# Patient Record
Sex: Male | Born: 1965 | Race: White | Hispanic: No | Marital: Married | Smoking: Never smoker
Health system: Southern US, Community
[De-identification: ages and names within clinical notes are randomized; demographics above are authoritative.]

## PROBLEM LIST (undated history)

## (undated) DIAGNOSIS — M722 Plantar fascial fibromatosis: Secondary | ICD-10-CM

## (undated) DIAGNOSIS — L209 Atopic dermatitis, unspecified: Secondary | ICD-10-CM

## (undated) DIAGNOSIS — E785 Hyperlipidemia, unspecified: Secondary | ICD-10-CM

## (undated) DIAGNOSIS — I10 Essential (primary) hypertension: Secondary | ICD-10-CM

## (undated) HISTORY — DX: Plantar fascial fibromatosis: M72.2

## (undated) HISTORY — DX: Atopic dermatitis, unspecified: L20.9

## (undated) HISTORY — PX: FRACTURE SURGERY: SHX138

## (undated) HISTORY — DX: Essential (primary) hypertension: I10

## (undated) HISTORY — DX: Hyperlipidemia, unspecified: E78.5

---

## 1989-09-30 HISTORY — PX: WRIST FRACTURE SURGERY: SHX121

## 2013-06-16 ENCOUNTER — Ambulatory Visit (INDEPENDENT_AMBULATORY_CARE_PROVIDER_SITE_OTHER): Payer: 59 | Admitting: Adult Health

## 2013-06-16 ENCOUNTER — Encounter: Payer: Self-pay | Admitting: Adult Health

## 2013-06-16 VITALS — BP 110/74 | HR 93 | Temp 98.2°F | Resp 12 | Ht 69.75 in | Wt 210.2 lb

## 2013-06-16 DIAGNOSIS — Z0000 Encounter for general adult medical examination without abnormal findings: Secondary | ICD-10-CM | POA: Insufficient documentation

## 2013-06-16 DIAGNOSIS — E669 Obesity, unspecified: Secondary | ICD-10-CM | POA: Insufficient documentation

## 2013-06-16 DIAGNOSIS — G479 Sleep disorder, unspecified: Secondary | ICD-10-CM

## 2013-06-16 DIAGNOSIS — E785 Hyperlipidemia, unspecified: Secondary | ICD-10-CM | POA: Insufficient documentation

## 2013-06-16 MED ORDER — ATORVASTATIN CALCIUM 10 MG PO TABS: 10.0000 mg | ORAL_TABLET | Freq: Every day | ORAL | Status: DC

## 2013-06-16 MED ORDER — TEMAZEPAM 15 MG PO CAPS: 15.0000 mg | ORAL_CAPSULE | Freq: Every evening | ORAL | Status: DC | PRN

## 2013-06-16 NOTE — Assessment & Plan Note (Signed)
Normal physical exam. No history of depression, no tobacco use. Cholesterol elevated and starting atorvastatin. Check CBC and hepatic panel. Other labs drawn during health fair through his employer.

## 2013-06-16 NOTE — Assessment & Plan Note (Signed)
Patient has tried diet and exercise without being able to regulate his cholesterol. Start atorvastatin 10 mg at bedtime. Check hepatic panel. Creatinine has been checked and is normal. Return in 3 months for followup and lab work.

## 2013-06-16 NOTE — Patient Instructions (Addendum)
  Please have your labs drawn at [facility] prior to starting the Atorvastatin.  Then start your cholesterol medication (Atorvastatin) and take at bedtime.  Temazepam 15 mg as needed nightly for help with sleep.  Thank you for choosing [facility] for your health care needs.  The results will be available through MyChart for your convenience. Please remember to activate this. The activation code is located at the end of this form.

## 2013-06-16 NOTE — Assessment & Plan Note (Addendum)
Exercising most days depending on how tired he is feeling from work. Trying to work on losing some weight. Starting medication for cholesterol. Continue to follow. Consider medication for appetite suppression if ongoing diet exercise not effective.

## 2013-06-16 NOTE — Assessment & Plan Note (Signed)
Unable to turn his brain off at night. He has tried multiple sleep aids without success. He has also been prescribed multiple medications to help with sleep, however, none have proven beneficial. Start temazepam 15 mg at bedtime as needed.

## 2013-06-16 NOTE — Progress Notes (Signed)
Subjective:    Patient ID: Jonathan Key, male    DOB: November 30, 1965, 47 y.o.   MRN: [id number]  HPI  Patient is a pleasant 47 y/o male who presents to clinic to establish care. He was previously living in [place], [place] and had a PCP there.   Having problems with insomnia for many years. He has tried numerous OTC aids and prescriptions such as lunesta, Remus Loffler but none of these have been effective. The only medication he has found helpful is temazepam. This was prescribed by his previous PCP however he currently does not have a prescription for it. Patient brings with him lab work that was done as part of a health screen at his job, [employer]. Hemoglobin A1c 5.6%, creatinine 1.13 mg/dL, GFR 77, total cholesterol 243 mg/dL, triglycerides 409 mg/dL, HDL 72 mg/dL, LDL 811 mg/dL, glucose 96 mg/dL.  Patient reports that he has had elevated cholesterol for quite some time. He has tried diet and exercise without being able to regulate his cholesterol. He reports exercising regularly and following a healthy diet. He has never been on any medication for hyperlipidemia.    Past Medical History  Diagnosis Date  . Hyperlipidemia   . Hypertension   . Plantar fasciitis      Past Surgical History  Procedure Laterality Date  . Fracture surgery  2002 and 2003    right arm fracture with 3 surgical repairs     Family History  Problem Relation Age of Onset  . Cancer Maternal Aunt 60    Breast Cancer   . Diabetes Maternal Grandmother   . Cancer Maternal Grandmother   . Dementia Maternal Grandmother   . Cancer Maternal Grandfather 97    Stomach Cancer  . Parkinson's disease Paternal Grandfather      History   Social History  . Marital Status: Married    Spouse Name: Murry Diaz    Number of Children: N/A  . Years of Education: 15   Occupational History  . Not on file.   Social History Main Topics  . Smoking status: Never Smoker   . Smokeless tobacco: Never Used  . Alcohol  Use: Yes     Comment: 5-7 drinks per day  . Drug Use: No  . Sexual Activity: Not on file   Other Topics Concern  . Not on file   Social History Narrative   Kaelyn grew up in [place]. 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 He worked in [employer] and moved quite often with them up and down the [employer]. He is currently working for [employer] as an Chief Financial Officer. He lives at home with his wife, [name], of 15 years. They do not have any children. They have a cat. Hikeem enjoys yard work, traveling and riding motorcycle.      Review of Systems  Constitutional: Negative.   HENT: Negative.   Eyes: Negative.   Respiratory: Negative.   Cardiovascular: Negative.   Gastrointestinal: Negative.   Endocrine: Negative.   Genitourinary: Negative.   Musculoskeletal: Negative for back pain, joint swelling and gait problem.       Occasional pain in hands. He believes it may be arthritis. It is not debilitating. Problems with plantar fasciitis. He has been followed by a podiatrist.  Skin: Negative.   Allergic/Immunologic: Negative.   Neurological: Negative.   Hematological: Negative.   Psychiatric/Behavioral: Positive for sleep disturbance.  No depression or feelings of hopelessness.     BP 110/74  Pulse 93  Temp(Src) 98.2 F (36.8 C) (Oral)  Resp 12  Ht 5' 9.75" (1.772 m)  Wt 210 lb 4 oz (95.369 kg)  BMI 30.37 kg/m2  SpO2 98%    Objective:   Physical Exam  Constitutional: He is oriented to person, place, and time. He appears well-developed and well-nourished. No distress.  HENT:  Head: Normocephalic and atraumatic.  Right Ear: External ear normal.  Left Ear: External ear normal.  Nose: Nose normal.  Mouth/Throat: Oropharynx is clear and moist.  Eyes: EOM are normal. Pupils are equal, round, and reactive to light.  Neck: Normal range of motion.  Neck supple. No tracheal deviation present. No thyromegaly present.  Cardiovascular: Normal rate, regular rhythm, normal heart sounds and intact distal pulses.  Exam reveals no gallop and no friction rub.   No murmur heard. Pulmonary/Chest: Effort normal and breath sounds normal. No respiratory distress. He has no wheezes. He has no rales. He exhibits no tenderness.  Abdominal: Soft. Bowel sounds are normal. He exhibits no distension and no mass. There is no tenderness. There is no rebound and no guarding.  Musculoskeletal: Normal range of motion. He exhibits no edema and no tenderness.  Lymphadenopathy:    He has no cervical adenopathy.  Neurological: He is alert and oriented to person, place, and time. He has normal reflexes. No cranial nerve deficit. Coordination normal.  Skin: Skin is warm and dry. No rash noted. No erythema. No pallor.  Psychiatric: He has a normal mood and affect. His behavior is normal. Judgment and thought content normal.       Assessment & Plan:

## 2013-06-18 ENCOUNTER — Telehealth: Payer: Self-pay | Admitting: *Deleted

## 2013-06-18 NOTE — Telephone Encounter (Signed)
Left message, notifying pt of normal lab results.

## 2013-06-28 ENCOUNTER — Encounter: Payer: Self-pay | Admitting: Adult Health

## 2013-08-05 ENCOUNTER — Other Ambulatory Visit: Payer: Self-pay

## 2013-08-30 ENCOUNTER — Encounter: Payer: Self-pay | Admitting: Adult Health

## 2013-09-03 ENCOUNTER — Other Ambulatory Visit: Payer: Self-pay | Admitting: Adult Health

## 2013-09-03 MED ORDER — TRAZODONE HCL 50 MG PO TABS: 25.0000 mg | ORAL_TABLET | Freq: Every evening | ORAL | Status: DC | PRN

## 2013-09-29 ENCOUNTER — Other Ambulatory Visit: Payer: Self-pay | Admitting: *Deleted

## 2013-10-01 ENCOUNTER — Other Ambulatory Visit: Payer: Self-pay | Admitting: *Deleted

## 2013-10-01 NOTE — Telephone Encounter (Signed)
Sent myChart message

## 2013-10-01 NOTE — Telephone Encounter (Signed)
I had previously sent pt an email stating that I wanted him to take either the trazodone or restoril, not both. Can you find out which one is working for him?

## 2013-10-01 NOTE — Telephone Encounter (Signed)
Ok refill both?

## 2013-10-04 ENCOUNTER — Telehealth: Payer: Self-pay | Admitting: Adult Health

## 2013-10-04 ENCOUNTER — Other Ambulatory Visit: Payer: Self-pay | Admitting: *Deleted

## 2013-10-04 MED ORDER — TEMAZEPAM 15 MG PO CAPS: ORAL_CAPSULE | ORAL | Status: DC

## 2013-10-04 NOTE — Telephone Encounter (Signed)
Increase temazepam to 1-2 capsules at bedtime prn

## 2013-10-04 NOTE — Telephone Encounter (Signed)
Rx faxed to pharmacy  

## 2014-03-28 LAB — BASIC METABOLIC PANEL
CREATININE: 1.1 mg/dL (ref 0.6–1.3)
GLUCOSE: 107 mg/dL

## 2014-03-28 LAB — HEMOGLOBIN A1C: Hgb A1c MFr Bld: 5.6 % (ref 4.0–6.0)

## 2014-03-28 LAB — LIPID PANEL
Cholesterol: 239 mg/dL — AB (ref 0–200)
HDL: 74 mg/dL — AB (ref 35–70)
LDL CALC: 147 mg/dL
Triglycerides: 89 mg/dL (ref 40–160)

## 2014-05-18 ENCOUNTER — Encounter: Payer: Self-pay | Admitting: Adult Health

## 2014-05-27 ENCOUNTER — Ambulatory Visit (INDEPENDENT_AMBULATORY_CARE_PROVIDER_SITE_OTHER): Payer: 59 | Admitting: Adult Health

## 2014-05-27 ENCOUNTER — Encounter: Payer: Self-pay | Admitting: Adult Health

## 2014-05-27 VITALS — BP 115/81 | HR 74 | Temp 97.9°F | Resp 14 | Wt 204.5 lb

## 2014-05-27 DIAGNOSIS — E785 Hyperlipidemia, unspecified: Secondary | ICD-10-CM

## 2014-05-27 DIAGNOSIS — G479 Sleep disorder, unspecified: Secondary | ICD-10-CM

## 2014-05-27 MED ORDER — SUVOREXANT 10 MG PO TABS: 10.0000 mg | ORAL_TABLET | Freq: Every day | ORAL | Status: DC

## 2014-05-27 NOTE — Progress Notes (Signed)
Patient ID: Jonathan Key, male   DOB: [birth year], 48 y.o.   MRN: [id number]   Subjective:    Patient ID: Jonathan Key, male    DOB: [birth year], 48 y.o.   MRN: [id number]  HPI  Patient is a pleasant 48 year old male who presents to clinic for followup of hyperlipidemia and sleep disturbance. Pertaining to his sleep disturbance, he has been taking on and Restoril without any benefit. He did not like the effects of the medication. He reports that these medications had the opposite effect. He has tried OTC medications - melatonin, alteril but did not help. He does not exercise right before bedtime. The restoril makes him feel angry the next day.   Pertaining his HLD, he was prescribed atorvastatin 10 mg but he did not take this. Was concerned about side effects. He tried diet and exercise. LDL is slightly above target of below 130. Just had labs through employer.    Past Medical History  Diagnosis Date  . Hyperlipidemia   . Hypertension   . Plantar fasciitis     Current Outpatient Prescriptions on File Prior to Visit  Medication Sig Dispense Refill  . cholecalciferol (VITAMIN D) 1000 UNITS tablet Take 1,000 Units by mouth daily.      . Multiple Vitamin (MULTIVITAMIN) tablet Take 1 tablet by mouth daily.      Marland Kitchen atorvastatin (LIPITOR) 10 MG tablet Take 1 tablet (10 mg total) by mouth daily.  30 tablet  3   No current facility-administered medications on file prior to visit.     Review of Systems  Constitutional: Negative.   HENT: Negative.   Eyes: Negative.   Respiratory: Negative.   Cardiovascular: Negative.   Gastrointestinal: Negative.   Endocrine: Negative.   Genitourinary: Negative.   Musculoskeletal: Negative.   Skin: Negative.   Allergic/Immunologic: Negative.   Neurological: Negative.   Hematological: Negative.   Psychiatric/Behavioral: Positive for sleep disturbance. Negative for behavioral problems, confusion and agitation. The patient is nervous/anxious.          Objective:  There were no vitals taken for this visit.   Physical Exam  Constitutional: He is oriented to person, place, and time. He appears well-developed and well-nourished. No distress.  HENT:  Head: Normocephalic and atraumatic.  Eyes: Conjunctivae and EOM are normal.  Cardiovascular: Normal rate, regular rhythm, normal heart sounds and intact distal pulses.  Exam reveals no gallop and no friction rub.   No murmur heard. Pulmonary/Chest: Effort normal and breath sounds normal. No respiratory distress. He has no wheezes. He has no rales.  Musculoskeletal: Normal range of motion.  Neurological: He is alert and oriented to person, place, and time.  Skin: Skin is warm and dry.  Psychiatric: He has a normal mood and affect. His behavior is normal. Judgment and thought content normal.      Assessment & Plan:   1. Hyperlipidemia with target LDL less than 100 Did not take atorvastatin. We discussed that genetics plays an important role is some individuals. If he is exercising and watching diet and LDL not improving he may need to take medication. He is going to try red yeast rice and have repeat lipids in 3 months. Continue exercise. He has only be doing 10-15 min of cardio and then weights. Advised to do cardio at least 30 min 3 days a week and weights on opposite days. Follow up 3-6 months or sooner if necessary.  2. Sleep disturbance Has tried multiple medication as well as OTC meds without help. Trial  of belsomra x 10 days. If this works will send prescription. Discussed side effects, how to correctly take medication. Discussed sleep hygiene measure to prevent too much stimulation prior to bedtime.

## 2014-05-27 NOTE — Progress Notes (Signed)
Pre visit review using our clinic review tool, if applicable. No additional management support is needed unless otherwise documented below in the visit note. 

## 2014-05-31 ENCOUNTER — Other Ambulatory Visit: Payer: Self-pay | Admitting: Adult Health

## 2014-05-31 ENCOUNTER — Encounter: Payer: Self-pay | Admitting: Adult Health

## 2014-05-31 MED ORDER — SUVOREXANT 10 MG PO TABS
10.0000 mg | ORAL_TABLET | Freq: Every day | ORAL | Status: DC
Start: 2014-05-31 — End: 2015-10-03

## 2014-07-04 ENCOUNTER — Ambulatory Visit (INDEPENDENT_AMBULATORY_CARE_PROVIDER_SITE_OTHER): Payer: 59 | Admitting: Internal Medicine

## 2014-07-04 ENCOUNTER — Encounter: Payer: Self-pay | Admitting: Internal Medicine

## 2014-07-04 VITALS — BP 118/72 | HR 101 | Temp 98.9°F | Resp 18 | Ht 69.75 in | Wt 209.2 lb

## 2014-07-04 DIAGNOSIS — J0110 Acute frontal sinusitis, unspecified: Secondary | ICD-10-CM

## 2014-07-04 DIAGNOSIS — J019 Acute sinusitis, unspecified: Secondary | ICD-10-CM | POA: Insufficient documentation

## 2014-07-04 DIAGNOSIS — J0140 Acute pansinusitis, unspecified: Secondary | ICD-10-CM

## 2014-07-04 MED ORDER — BENZONATATE 200 MG PO CAPS: 200.0000 mg | ORAL_CAPSULE | Freq: Three times a day (TID) | ORAL | Status: DC | PRN

## 2014-07-04 MED ORDER — HYDROCOD POLST-CHLORPHEN POLST 10-8 MG/5ML PO LQCR: 5.0000 mL | Freq: Every evening | ORAL | Status: DC | PRN

## 2014-07-04 MED ORDER — PREDNISONE (PAK) 10 MG PO TABS: ORAL_TABLET | ORAL | Status: DC

## 2014-07-04 MED ORDER — HYDROCOD POLST-CHLORPHEN POLST 10-8 MG/5ML PO LQCR: 10.0000 mL | Freq: Every evening | ORAL | Status: DC | PRN

## 2014-07-04 MED ORDER — LEVOFLOXACIN 500 MG PO TABS: 500.0000 mg | ORAL_TABLET | Freq: Every day | ORAL | Status: DC

## 2014-07-04 NOTE — Progress Notes (Signed)
Patient ID: Jonathan Key, male   DOB: [birth year], 48 y.o.   MRN: [id number]  Patient Active Problem List   Diagnosis Date Noted  . Sinusitis, acute 07/06/2014  . Acute sinusitis 07/04/2014  . Routine general medical examination at a health care facility 06/16/2013  . Sleep disturbance 06/16/2013  . Hyperlipidemia with target LDL less than 100 06/16/2013  . Obesity (BMI 30-39.9) 06/16/2013    Subjective:  CC:   Chief Complaint  Patient presents with  . Acute Visit    started last wed. with sneezing  and drainage down throat.  . Cough    Dry hacking cough, sweats but afebrile .    HPI:   [name] is a 48 y.o. male who presents for   Cough started Wednesday evening  . Started with sneezing,  PND,  Sore throat,  Resolved after 1.5 days,  Now congested on both sided and coughing  taking otc meds for the 4 days.  opugh is getting worse ,  Dry hack ,  Occasional fits  Of coughing,  Hurts in the chest . No wheezing,  Subjective fevers today breaking out in a sweat.   Recent trip to [facility] , stayed in a rental home which preceded the onset of symptoms.  Did some swimming in the house pool and jet skiiing in the ocean.  No air travel, or public transportation    Past Medical History  Diagnosis Date  . Hyperlipidemia   . Hypertension   . Plantar fasciitis     Past Surgical History  Procedure Laterality Date  . Fracture surgery  2002 and 2003    right arm fracture with 3 surgical repairs       The following portions of the patient's history were reviewed and updated as appropriate: Allergies, current medications, and problem list.    Review of Systems:   Patient denies headache, fevers, malaise, unintentional weight loss, skin rash, eye pain, sinus congestion and sinus pain, sore throat, dysphagia,  hemoptysis , cough, dyspnea, wheezing, chest pain, palpitations, orthopnea, edema, abdominal pain, nausea, melena, diarrhea, constipation, flank pain, dysuria,  hematuria, urinary  Frequency, nocturia, numbness, tingling, seizures,  Focal weakness, Loss of consciousness,  Tremor, insomnia, depression, anxiety, and suicidal ideation.     History   Social History  . Marital Status: Married    Spouse Name: [name]    Number of Children: N/A  . Years of Education: 15   Occupational History  . Not on file.   Social History Main Topics  . Smoking status: Never Smoker   . Smokeless tobacco: Never Used  . Alcohol Use: Yes     Comment: 5-7 drinks per day  . Drug Use: No  . Sexual Activity: Not on file   Other Topics Concern  . Not on file   Social History Narrative   Jedediah grew up in [place]. He attended Hotel manager school at [employer] where he obtained a certificate in Facilities manager and Set designer in [place]. He worked in [employer] and moved quite often with them up and down the [employer]. 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 He lives at home with his wife, Jonathan Key, of 15 years. They do not have any children. They have a cat. Jonathan Key enjoys yard work, traveling and riding motorcycle.     Objective:  Filed Vitals:   07/04/14 1621  BP: 118/72  Pulse: 101  Temp: 98.9 F (37.2  C)  Resp: 18     General appearance: alert, cooperative and appears stated age Ears: bilateral injectd T's .  No bulging or effusions Troat: lips, mucosa, and tongue normal; teeth and gums normal Neck: no adenopathy, no carotid bruit, supple, symmetrical, trachea midline and thyroid not enlarged, symmetric, no tenderness/mass/nodules Back: symmetric, no curvature. ROM normal. No CVA tenderness. Lungs: clear to auscultation bilaterally Heart: regular rate and rhythm, S1, S2 normal, no murmur, click, rub or gallop Abdomen: soft, non-tender; bowel sounds normal; no masses,  no organomegaly Pulses: 2+ and symmetric Skin: Skin color, texture, turgor normal. No rashes or lesions Lymph  nodes: Cervical, supraclavicular, and axillary nodes normal.  Assessment and Plan:  Sinusitis, acute With otitis on exam. Levaquin.prednisone. otc decongestants   Updated Medication List Outpatient Encounter Prescriptions as of 07/04/2014  Medication Sig  . cholecalciferol (VITAMIN D) 1000 UNITS tablet Take 1,000 Units by mouth daily.  Marland Kitchen DM-Doxylamine-Acetaminophen (NYQUIL COLD & FLU PO) Take 30 mLs by mouth at bedtime as needed.  . Multiple Vitamin (MULTIVITAMIN) tablet Take 1 tablet by mouth daily.  Marland Kitchen Phenyleph-Doxyl-DM-Aspirin (ALKA-SELTZER PLUS NIGHT COLD) 7.8-6.25-10-500 MG TBEF Take 1 packet by mouth daily.  . Suvorexant (BELSOMRA) 10 MG TABS Take 10 mg by mouth at bedtime.  Marland Kitchen atorvastatin (LIPITOR) 10 MG tablet Take 1 tablet (10 mg total) by mouth daily.  . benzonatate (TESSALON) 200 MG capsule Take 1 capsule (200 mg total) by mouth 3 (three) times daily as needed for cough.  . chlorpheniramine-HYDROcodone (TUSSIONEX) 10-8 MG/5ML LQCR Take 5 mLs by mouth at bedtime as needed for cough.  Marland Kitchen levofloxacin (LEVAQUIN) 500 MG tablet Take 1 tablet (500 mg total) by mouth daily.  . predniSONE (STERAPRED UNI-PAK) 10 MG tablet 6 tablets on Day 1 , then reduce by 1 tablet daily until gone  . [DISCONTINUED] chlorpheniramine-HYDROcodone (TUSSIONEX) 10-8 MG/5ML LQCR Take 10 mLs by mouth at bedtime as needed for cough.     No orders of the defined types were placed in this encounter.    No Follow-up on file.

## 2014-07-04 NOTE — Patient Instructions (Signed)
I am treating you for sinusitis/otitis which is ca complication from your viral infection due to  persistent sinus congestion.   I am prescribing an antibiotic (levaquin) and a prednisone taper  To manage the infection and the inflammation in your ear/sinuses.   I also advise use of the following OTC meds to help with your other symptoms.   Take generic OTC benadryl 25 mg every 8 hours for the drainage,  Sudafed PE  10 to 30 mg every 8 hours for the congestion, you may substitute Afrin nasal spray for the nighttime dose of sudafed PE  If needed to prevent insomnia.  flush your sinuses twice daily with Simply Saline (do over the sink because if you do it right you will spit out globs of mucus)  Use benzonatate capsules   FOR THE COUGH.  Gargle with salt water as needed for sore throat.   Use the tussionex liquid cough syrup at night fr severe cough or headache.  It has vicodin in it so DO NOT [place]

## 2014-07-04 NOTE — Progress Notes (Signed)
Pre-visit discussion using our clinic review tool. No additional management support is needed unless otherwise documented below in the visit note.  

## 2014-07-06 DIAGNOSIS — J019 Acute sinusitis, unspecified: Secondary | ICD-10-CM | POA: Insufficient documentation

## 2014-07-06 NOTE — Assessment & Plan Note (Signed)
With otitis on exam. Levaquin.prednisone. otc decongestants

## 2014-07-28 ENCOUNTER — Ambulatory Visit (INDEPENDENT_AMBULATORY_CARE_PROVIDER_SITE_OTHER): Payer: 59 | Admitting: Internal Medicine

## 2014-07-28 ENCOUNTER — Encounter: Payer: Self-pay | Admitting: Internal Medicine

## 2014-07-28 VITALS — BP 118/70 | HR 83 | Temp 98.2°F | Ht 69.75 in | Wt 209.2 lb

## 2014-07-28 DIAGNOSIS — J0100 Acute maxillary sinusitis, unspecified: Secondary | ICD-10-CM

## 2014-07-28 MED ORDER — DOXYCYCLINE HYCLATE 100 MG PO TABS: 100.0000 mg | ORAL_TABLET | Freq: Two times a day (BID) | ORAL | Status: DC

## 2014-07-28 NOTE — Progress Notes (Signed)
Subjective:    Patient ID: Jonathan Key, male    DOB: June 16, 1966, 48 y.o.   MRN: [id number]  HPI 48YO male presents for acute visit.  Seen by Dr. Derrel Nip 10/5 and treated with Prednisone and Levaquin for otitis media. Chest cold cleared up, but continues to have right sided sinus pressure and right ear pain. No recent foreign travel. No h/o sinus surgery. Non-smoker. No fever. Not currently taking anything for symptoms. He did complete Prednisone and antibiotics as instructed.   Review of Systems  Constitutional: Negative for fever, chills, activity change and fatigue.  HENT: Positive for congestion, ear pain and sinus pressure. Negative for ear discharge, hearing loss, nosebleeds, postnasal drip, rhinorrhea, sneezing, sore throat, tinnitus, trouble swallowing and voice change.   Eyes: Negative for discharge, redness, itching and visual disturbance.  Respiratory: Negative for cough, chest tightness, shortness of breath, wheezing and stridor.   Cardiovascular: Negative for chest pain and leg swelling.  Musculoskeletal: Negative for arthralgias, myalgias, neck pain and neck stiffness.  Skin: Negative for color change and rash.  Neurological: Negative for dizziness, facial asymmetry and headaches.  Psychiatric/Behavioral: Negative for sleep disturbance.       Objective:    BP 118/70  Pulse 83  Temp(Src) 98.2 F (36.8 C) (Oral)  Ht 5' 9.75" (1.772 m)  Wt 209 lb 4 oz (94.915 kg)  BMI 30.23 kg/m2  SpO2 97% Physical Exam  Constitutional: He is oriented to person, place, and time. He appears well-developed and well-nourished. No distress.  HENT:  Head: Normocephalic and atraumatic.  Right Ear: External ear normal. Tympanic membrane is bulging. A middle ear effusion is present.  Left Ear: External ear normal. Tympanic membrane is bulging. A middle ear effusion is present.  Nose: No mucosal edema. Right sinus exhibits maxillary sinus tenderness (pressure).  Mouth/Throat:  Oropharynx is clear and moist. No oropharyngeal exudate.  Eyes: Conjunctivae and EOM are normal. Pupils are equal, round, and reactive to light. Right eye exhibits no discharge. Left eye exhibits no discharge. No scleral icterus.  Neck: Normal range of motion. Neck supple. No tracheal deviation present. No thyromegaly present.  Cardiovascular: Normal rate, regular rhythm and normal heart sounds.  Exam reveals no gallop and no friction rub.   No murmur heard. Pulmonary/Chest: Effort normal and breath sounds normal. No accessory muscle usage. Not tachypneic. No respiratory distress. He has no decreased breath sounds. He has no wheezes. He has no rhonchi. He has no rales. He exhibits no tenderness.  Musculoskeletal: Normal range of motion. He exhibits no edema.  Lymphadenopathy:    He has no cervical adenopathy.  Neurological: He is alert and oriented to person, place, and time. No cranial nerve deficit. Coordination normal.  Skin: Skin is warm and dry. No rash noted. He is not diaphoretic. No erythema. No pallor.  Psychiatric: He has a normal mood and affect. His behavior is normal. Judgment and thought content normal.          Assessment & Plan:   Problem List Items Addressed This Visit     Unprioritized   Acute maxillary sinusitis - Primary     Symptoms consistent with maxillary sinusitis. He has been treated with Levaquin and Prednisone with no improvement. Question obstruction. Will set up ENT evaluation for direct visualization. Will start Doxycycline in the interim. Follow up in 4 weeks and prn.    Relevant Medications      DOXYCYCLINE HYCLATE 100 MG PO TABS   Other Relevant Orders  Ambulatory referral to ENT       Return in about 4 weeks (around 08/25/2014).

## 2014-07-28 NOTE — Assessment & Plan Note (Signed)
Symptoms consistent with maxillary sinusitis. He has been treated with Levaquin and Prednisone with no improvement. Question obstruction. Will set up ENT evaluation for direct visualization. Will start Doxycycline in the interim. Follow up in 4 weeks and prn.

## 2014-07-28 NOTE — Patient Instructions (Signed)
Start Doxycyline.  We will set up an evaluation with ENT.

## 2014-07-28 NOTE — Progress Notes (Signed)
Pre visit review using our clinic review tool, if applicable. No additional management support is needed unless otherwise documented below in the visit note. 

## 2014-08-29 ENCOUNTER — Encounter: Payer: Self-pay | Admitting: Internal Medicine

## 2014-09-05 ENCOUNTER — Ambulatory Visit: Payer: 59 | Admitting: Internal Medicine

## 2014-11-28 ENCOUNTER — Ambulatory Visit (INDEPENDENT_AMBULATORY_CARE_PROVIDER_SITE_OTHER): Payer: 59 | Admitting: Internal Medicine

## 2014-11-28 ENCOUNTER — Encounter: Payer: Self-pay | Admitting: Internal Medicine

## 2014-11-28 VITALS — BP 120/78 | HR 79 | Temp 97.9°F | Resp 16 | Ht 70.0 in | Wt 214.0 lb

## 2014-11-28 DIAGNOSIS — Z23 Encounter for immunization: Secondary | ICD-10-CM

## 2014-11-28 DIAGNOSIS — E669 Obesity, unspecified: Secondary | ICD-10-CM

## 2014-11-28 DIAGNOSIS — E785 Hyperlipidemia, unspecified: Secondary | ICD-10-CM

## 2014-11-28 DIAGNOSIS — G479 Sleep disorder, unspecified: Secondary | ICD-10-CM

## 2014-11-28 MED ORDER — ALPRAZOLAM 1 MG PO TABS: ORAL_TABLET | ORAL | Status: DC

## 2014-11-28 MED ORDER — TETANUS-DIPHTH-ACELL PERTUSSIS 5-2.5-18.5 LF-MCG/0.5 IM SUSP: 0.5000 mL | Freq: Once | INTRAMUSCULAR | Status: DC

## 2014-11-28 NOTE — Progress Notes (Signed)
Patient ID: Jonathan Key, male   DOB: [birth year], 49 y.o.   MRN: [id number]   Patient Active Problem List   Diagnosis Date Noted  . Routine general medical examination at a health care facility 06/16/2013  . Sleep disturbance 06/16/2013  . Hyperlipidemia with target LDL less than 100 06/16/2013  . Obesity (BMI 30-39.9) 06/16/2013    Subjective:  CC:   Chief Complaint  Patient presents with  . Follow-up    In generla and continues to have sleep issues.    HPI:   Jonathan Key is a 49 y.o. male who presents for  6  month follow up on hyperlipideemia and chronic insomnia.  At last visit in August, patient was advised to try RYR  And engaging in regular exercise  to lower cholesterol in lie of statin therapy. He has been exercising and taking RYR but only once daily.  His weight increased by 5 lbs since last visit.    Wt Readings from Last 3 Encounters:  11/28/14 214 lb (97.07 kg)  07/28/14 209 lb 4 oz (94.915 kg)  07/04/14 209 lb 4 oz (94.915 kg)    He continues to have trouble maintaining sleep despite multiple trials of medications.  Recent trial of Belsomra was no more efficacious that temazepam.  He feels tired into the morning, which makes him irritable. His primary issue is early wakeup, with 2 am wakening unable to go back to sleep.  Problems is more persistent during the work week. Does not drink excessively but will have one or 2 beers before dinner,    Past Medical History  Diagnosis Date  . Hyperlipidemia   . Hypertension   . Plantar fasciitis     Past Surgical History  Procedure Laterality Date  . Fracture surgery  2002 and 2003    right arm fracture with 3 surgical repairs  . Wrist fracture surgery Right 1991    crush injury /[place]st[place]  [place]       The following portions of the patient's history were reviewed and updated as appropriate: Allergies, current medications, and problem list.    Review of Systems:   Patient denies headache,  fevers, malaise, unintentional weight loss, skin rash, eye pain, sinus congestion and sinus pain, sore throat, dysphagia,  hemoptysis , cough, dyspnea, wheezing, chest pain, palpitations, orthopnea, edema, abdominal pain, nausea, melena, diarrhea, constipation, flank pain, dysuria, hematuria, urinary  Frequency, nocturia, numbness, tingling, seizures,  Focal weakness, Loss of consciousness,  Tremor, insomnia, depression, anxiety, and suicidal ideation.     History   Social History  . Marital Status: Married    Spouse Name: Aram Domzalski  . Number of Children: N/A  . Years of Education: 15   Occupational History  . Not on file.   Social History Main Topics  . Smoking status: Never Smoker   . Smokeless tobacco: Never Used  . Alcohol Use: Yes     Comment: 5-7 drinks per day  . Drug Use: No  . Sexual Activity: Not on file   Other Topics Concern  . Not on file   Social History Narrative   Jonathan Key grew up in [place]. 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 He worked in [employer] and moved quite often with them up and down the [employer]. 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 He lives at home  with his wife, Jonathan Key, of 15 years. They do not have any children. They have a cat. Mihir enjoys yard work, traveling and riding motorcycle.     Objective:  Filed Vitals:   11/28/14 0904  BP: 120/78  Pulse: 79  Temp: 97.9 F (36.6 C)  Resp: 16     General appearance: alert, cooperative and appears stated age Ears: normal TM's and external ear canals both ears Throat: lips, mucosa, and tongue normal; teeth and gums normal Neck: no adenopathy, no carotid bruit, supple, symmetrical, trachea midline and thyroid not enlarged, symmetric, no tenderness/mass/nodules Back: symmetric, no curvature. ROM normal. No CVA tenderness. Lungs: clear to  auscultation bilaterally Heart: regular rate and rhythm, S1, S2 normal, no murmur, click, rub or gallop Abdomen: soft, non-tender; bowel sounds normal; no masses,  no organomegaly Pulses: 2+ and symmetric Skin: Skin color, texture, turgor normal. No rashes or lesions Lymph nodes: Cervical, supraclavicular, and axillary nodes normal.  Assessment and Plan:  Sleep disturbance Given his pattern, suggested trial of prn alprazolam for early wakeinngs.  30 day rx given . The risks and benefits of benzodiazepine use were discussed with patient today including excessive sedation leading to respiratory depression,  impaired thinking/driving, and addiction.  Patient was advised to avoid concurrent use with alcohol, to use medication only as needed and not to share with others  .    Hyperlipidemia with target LDL less than 100 He has no recent fasting lipid panel for review,  Last one in chart is 2014, but patient thinks he had it done elsewhere since he is a [employer] employee Will submit available records for review and return fasting after 6 week trial of RYR 600 mg bid,    Obesity (BMI 30-39.9) I have addressed  BMI and recommended wt loss of 10% of body weigh over the next 6 months using a low glycemic index diet and regular exercise a minimum of 5 days per week.  Body mass index is 30.71 kg/(m^2).   A total of 30 minutes was spent with patient more than half of which was spent in counseling patient on the above mentioned issues  and coordination of care.   Updated Medication List Outpatient Encounter Prescriptions as of 11/28/2014  Medication Sig  . cholecalciferol (VITAMIN D) 1000 UNITS tablet Take 1,000 Units by mouth daily.  . Multiple Vitamin (MULTIVITAMIN) tablet Take 1 tablet by mouth daily.  . Suvorexant (BELSOMRA) 10 MG TABS Take 10 mg by mouth at bedtime.  . ALPRAZolam (XANAX) 1 MG tablet 1/2  To 1 tablet in the evening as needed for insomnia  . Tdap (BOOSTRIX) 5-2.5-18.5 LF-MCG/0.5  injection Inject 0.5 mLs into the muscle once.  . [DISCONTINUED] benzonatate (TESSALON) 200 MG capsule Take 1 capsule (200 mg total) by mouth 3 (three) times daily as needed for cough.  . [DISCONTINUED] chlorpheniramine-HYDROcodone (TUSSIONEX) 10-8 MG/5ML LQCR Take 5 mLs by mouth at bedtime as needed for cough. (Patient not taking: Reported on 11/28/2014)  . [DISCONTINUED] doxycycline (VIBRA-TABS) 100 MG tablet Take 1 tablet (100 mg total) by mouth 2 (two) times daily. (Patient not taking: Reported on 11/28/2014)     Orders Placed This Encounter  Procedures  . Tdap vaccine greater than or equal to 7yo IM    Return in about 6 months (around 05/29/2015).

## 2014-11-28 NOTE — Progress Notes (Signed)
Pre-visit discussion using our clinic review tool. No additional management support is needed unless otherwise documented below in the visit note.  

## 2014-11-28 NOTE — Patient Instructions (Addendum)
For your insomnia:  I recommend a trial of alprazolam.  Start with 1/2 tablet and use AS NEEDED for early wakeups.  You may increase to full tablet the next time if it did not help.   I recommend that you get the tetanus-diptheria-pertussis vaccine (TDaP) but you may be able to get it at [facility]  with the script I have provided you.   For your cholesterol:  Please drop off your last panel for me to review.  The treatment dose of red yeast rice is 600 mg twice daily   We can repeat your lipids after 6 weeks of treatment dose therapy

## 2014-11-29 ENCOUNTER — Encounter: Payer: Self-pay | Admitting: Internal Medicine

## 2014-11-29 NOTE — Assessment & Plan Note (Signed)
He has no recent fasting lipid panel for review,  Last one in chart is 2014, but patient thinks he had it done elsewhere since he is a [employer] employee Will submit available records for review and return fasting after 6 week trial of RYR 600 mg bid,

## 2014-11-29 NOTE — Assessment & Plan Note (Signed)
Given his pattern, suggested trial of prn alprazolam for early wakeinngs.  30 day rx given . The risks and benefits of benzodiazepine use were discussed with patient today including excessive sedation leading to respiratory depression,  impaired thinking/driving, and addiction.  Patient was advised to avoid concurrent use with alcohol, to use medication only as needed and not to share with others  .

## 2014-11-29 NOTE — Assessment & Plan Note (Signed)
I have addressed  BMI and recommended wt loss of 10% of body weigh over the next 6 months using a low glycemic index diet and regular exercise a minimum of 5 days per week.  Body mass index is 30.71 kg/(m^2).

## 2014-12-02 ENCOUNTER — Telehealth: Payer: Self-pay | Admitting: Internal Medicine

## 2014-12-02 DIAGNOSIS — E785 Hyperlipidemia, unspecified: Secondary | ICD-10-CM

## 2014-12-02 DIAGNOSIS — Z1159 Encounter for screening for other viral diseases: Secondary | ICD-10-CM

## 2014-12-02 DIAGNOSIS — R5383 Other fatigue: Secondary | ICD-10-CM

## 2014-12-02 NOTE — Telephone Encounter (Signed)
See mychart.  

## 2014-12-02 NOTE — Assessment & Plan Note (Signed)
New ACC guidelines recommend starting patients aged 50 or higher on moderate intensity statin therapy for LDL between 70-189 and 10 yr risk of CAD > 7.5% ;  and high intensity therapy for anyone with LDL > 190.  Patient's 10 yr risk is 5.8% based on June 2015 fastings labs; therefore no medication is needed .

## 2014-12-28 ENCOUNTER — Other Ambulatory Visit: Payer: Self-pay | Admitting: Internal Medicine

## 2014-12-28 ENCOUNTER — Other Ambulatory Visit: Payer: Self-pay | Admitting: *Deleted

## 2014-12-28 MED ORDER — ALPRAZOLAM 1 MG PO TABS: ORAL_TABLET | ORAL | Status: DC

## 2014-12-28 NOTE — Telephone Encounter (Signed)
Script faxed as requested.

## 2015-05-26 LAB — LIPID PANEL
Cholesterol: 244 mg/dL — AB (ref 0–200)
HDL: 70 mg/dL (ref 35–70)
LDL Cholesterol: 158 mg/dL

## 2015-05-26 LAB — HEMOGLOBIN A1C

## 2015-10-03 ENCOUNTER — Ambulatory Visit (INDEPENDENT_AMBULATORY_CARE_PROVIDER_SITE_OTHER): Payer: 59 | Admitting: Nurse Practitioner

## 2015-10-03 ENCOUNTER — Encounter: Payer: Self-pay | Admitting: Nurse Practitioner

## 2015-10-03 VITALS — BP 124/78 | HR 91 | Temp 100.0°F | Ht 70.5 in | Wt 216.0 lb

## 2015-10-03 DIAGNOSIS — G479 Sleep disorder, unspecified: Secondary | ICD-10-CM

## 2015-10-03 DIAGNOSIS — Z23 Encounter for immunization: Secondary | ICD-10-CM | POA: Diagnosis not present

## 2015-10-03 DIAGNOSIS — R21 Rash and other nonspecific skin eruption: Secondary | ICD-10-CM

## 2015-10-03 MED ORDER — NYSTATIN-TRIAMCINOLONE 100000-0.1 UNIT/GM-% EX OINT: 1.0000 "application " | TOPICAL_OINTMENT | Freq: Two times a day (BID) | CUTANEOUS | Status: DC

## 2015-10-03 MED ORDER — ALPRAZOLAM 1 MG PO TABS: ORAL_TABLET | ORAL | Status: DC

## 2015-10-03 NOTE — Assessment & Plan Note (Signed)
Pt understands long term risks of chronic insomnia and benzodiazapine use. He takes 1/4th of the 1 mg. I asked him if he would like to switch to 1 tablet daily, but he reports it is working the way it is and he is okay with keeping this regimen. Script printed, given to pt to take to pharmacy. CSC signed, defer UDS to next visit.

## 2015-10-03 NOTE — Assessment & Plan Note (Signed)
Rash of unknown origin- new onset Treat with mycolog- asked to call if too expensive and we will separate the medications.  Keep in touch via MyChart on how treatment is going. Prednisone taper if not helpful FU prn worsening/failure to improve.

## 2015-10-03 NOTE — Progress Notes (Signed)
Patient ID: Jonathan Key, male    DOB: 27-Jan-1966  Age: 50 y.o. MRN: [id number]  CC: Rash and Medication Refill   HPI [name] presents for CC of rash 3 weeks and medication refill  1) Itchy painful intermittently, on the arms pre-axillary bilaterally, 3 weeks Pt reports it does not spread and the cream (see below) helps after a few days.  Treatment to date:  Triamcinolone 0.1% (wife's tube) Denies changes to detergents, body washes, travel, pets, and new foods  2) Xanax refill- takes 1/4 tab at night for insomnia  3) Screening summary from August. Abstracted by myself today 05/26/15 TC 244 HDL 70 LDL 158 Triglyceride 78 Glucose 107 BP 12/73 28.90 BMI 38.5 waist circ Nicotine Neg A1c 5.5   History Stellar has a past medical history of Hyperlipidemia; Hypertension; and Plantar fasciitis.   He has past surgical history that includes Fracture surgery (2002 and 2003) and Wrist fracture surgery (Right, 1991).   His family history includes Cancer in his maternal grandmother; Cancer (age of onset: 32) in his maternal aunt; Cancer (age of onset: 27) in his maternal grandfather; Dementia in his maternal grandmother; Diabetes in his maternal grandmother; Parkinson's disease in his paternal grandfather.He reports that he has never smoked. He has never used smokeless tobacco. He reports that he drinks alcohol. He reports that he does not use illicit drugs.  Outpatient Prescriptions Prior to Visit  Medication Sig Dispense Refill  . cholecalciferol (VITAMIN D) 1000 UNITS tablet Take 1,000 Units by mouth daily.    . Multiple Vitamin (MULTIVITAMIN) tablet Take 1 tablet by mouth daily.    Marland Kitchen ALPRAZolam (XANAX) 1 MG tablet 1/2  To 1 tablet in the evening as needed for insomnia 30 tablet 3  . Tdap (BOOSTRIX) 5-2.5-18.5 LF-MCG/0.5 injection Inject 0.5 mLs into the muscle once. 0.5 mL 0  . Suvorexant (BELSOMRA) 10 MG TABS Take 10 mg by mouth at bedtime. (Patient not taking: Reported on 10/03/2015)  90 tablet 3   No facility-administered medications prior to visit.    ROS Review of Systems  Constitutional: Negative for fever, chills, diaphoresis and fatigue.  HENT: Negative for drooling, facial swelling, trouble swallowing and voice change.   Respiratory: Negative for shortness of breath, wheezing and stridor.   Cardiovascular: Negative for chest pain, palpitations and leg swelling.  Skin: Positive for rash.  Psychiatric/Behavioral: Positive for sleep disturbance. Negative for suicidal ideas. The patient is not nervous/anxious.     Objective:  BP 124/78 mmHg  Pulse 91  Temp(Src) 100 F (37.8 C)  Ht 5' 10.5" (1.791 m)  Wt 216 lb (97.977 kg)  BMI 30.54 kg/m2  SpO2 97%  Physical Exam  Constitutional: He is oriented to person, place, and time. He appears well-developed and well-nourished. No distress.  HENT:  Head: Normocephalic and atraumatic.  Right Ear: External ear normal.  Left Ear: External ear normal.  Mouth/Throat: No oropharyngeal exudate.  Eyes: EOM are normal. Pupils are equal, round, and reactive to light. Right eye exhibits no discharge. Left eye exhibits no discharge. No scleral icterus.  Neurological: He is alert and oriented to person, place, and time. Coordination normal.  Skin: Skin is warm and dry. Rash noted. He is not diaphoretic. No erythema. No pallor.     Light pink/red papular generalized rash  No pustules, discharge, confluent areas, vesicles  Psychiatric: He has a normal mood and affect. His behavior is normal. Judgment and thought content normal.      Assessment & Plan:   Jonathan Key  was seen today for rash and medication refill.  Diagnoses and all orders for this visit:  Encounter for immunization  Rash and nonspecific skin eruption  Sleep disturbance  Other orders -     Flu Vaccine QUAD 36+ mos IM -     nystatin-triamcinolone ointment (MYCOLOG); Apply 1 application topically 2 (two) times daily. -     ALPRAZolam (XANAX) 1 MG tablet;  1/2  To 1 tablet in the evening as needed for insomnia   I have discontinued Mr. Asada Suvorexant. I am also having him start on nystatin-triamcinolone ointment. Additionally, I am having him maintain his multivitamin, cholecalciferol, Tdap, and ALPRAZolam.  Meds ordered this encounter  Medications  . nystatin-triamcinolone ointment (MYCOLOG)    Sig: Apply 1 application topically 2 (two) times daily.    Dispense:  30 g    Refill:  0    Order Specific Question:  Supervising Provider    Answer:  Deborra Medina L [[id number]]  . ALPRAZolam (XANAX) 1 MG tablet    Sig: 1/2  To 1 tablet in the evening as needed for insomnia    Dispense:  30 tablet    Refill:  3    Order Specific Question:  Supervising Provider    Answer:  Crecencio Mc [[id number]]     Follow-up: Return if symptoms worsen or fail to improve.

## 2015-10-03 NOTE — Patient Instructions (Addendum)
Follow up via MyChart to keep me updated on how it is working.   Call if too expensive or Mychart (Calling might be faster)  Nice to meet you!

## 2015-10-09 ENCOUNTER — Encounter: Payer: Self-pay | Admitting: Nurse Practitioner

## 2015-10-09 ENCOUNTER — Other Ambulatory Visit: Payer: Self-pay | Admitting: Nurse Practitioner

## 2015-10-09 MED ORDER — PREDNISONE 10 MG PO TABS: ORAL_TABLET | ORAL | Status: DC

## 2015-10-11 ENCOUNTER — Ambulatory Visit: Payer: Self-pay | Admitting: Internal Medicine

## 2015-10-17 ENCOUNTER — Other Ambulatory Visit: Payer: Self-pay | Admitting: Nurse Practitioner

## 2015-10-17 MED ORDER — PREDNISONE 10 MG PO TABS: ORAL_TABLET | ORAL | Status: DC

## 2015-10-23 ENCOUNTER — Other Ambulatory Visit: Payer: Self-pay | Admitting: Nurse Practitioner

## 2015-10-23 DIAGNOSIS — R21 Rash and other nonspecific skin eruption: Secondary | ICD-10-CM

## 2015-10-24 ENCOUNTER — Telehealth: Payer: Self-pay | Admitting: Nurse Practitioner

## 2015-10-24 NOTE — Telephone Encounter (Signed)
Can you see if the order was sent or as been placed with a particular dermatologist? Please.

## 2015-10-25 ENCOUNTER — Other Ambulatory Visit: Payer: Self-pay | Admitting: Nurse Practitioner

## 2015-10-25 DIAGNOSIS — R21 Rash and other nonspecific skin eruption: Secondary | ICD-10-CM

## 2015-10-26 ENCOUNTER — Encounter: Payer: Self-pay | Admitting: Nurse Practitioner

## 2016-04-11 ENCOUNTER — Telehealth: Payer: Self-pay | Admitting: Nurse Practitioner

## 2016-04-11 NOTE — Telephone Encounter (Signed)
Received email via [facility] website patient questioning billing from 10/03/15.  Advised patient we will send notification to [facility] to remove the immunization charges.  Asked patient if he would also like to select new PCP, and selected Dr. Caryl Bis.  Advised patient if billing not resolved in 6 weeks to call office again and ask for Lorriane Shire, Team Lead or myself.

## 2019-07-27 ENCOUNTER — Ambulatory Visit: Payer: Self-pay | Admitting: Adult Health

## 2019-08-04 ENCOUNTER — Ambulatory Visit: Payer: Managed Care, Other (non HMO) | Admitting: Adult Health

## 2019-08-04 ENCOUNTER — Other Ambulatory Visit: Payer: Self-pay

## 2019-08-04 ENCOUNTER — Encounter: Payer: Self-pay | Admitting: Adult Health

## 2019-08-04 VITALS — BP 133/82 | HR 96 | Temp 98.0°F | Resp 16 | Ht 70.5 in | Wt 222.0 lb

## 2019-08-04 DIAGNOSIS — G4700 Insomnia, unspecified: Secondary | ICD-10-CM

## 2019-08-04 DIAGNOSIS — Z7689 Persons encountering health services in other specified circumstances: Secondary | ICD-10-CM

## 2019-08-04 NOTE — Progress Notes (Signed)
[facility] [place], [place] [address]  Internal MEDICINE  Office Visit Note  Patient Name: Jonathan Key  [id number]  [id number]  Date of Service: 08/04/2019   Complaints/HPI Pt is here for establishment of PCP. Chief Complaint  Patient presents with  . New Patient (Initial Visit)  . Hypertension  . Hyperlipidemia   HPI  Patient is here today as a new patient. Major complaint is racing thoughts at night that prevent patient from falling asleep during the night. Patient reports being able to fall asleep without issues but wakes up in the middle of night around 1-2 AM to use restroom and is unable to fall back asleep. Patient reports exhausting all resources to help with sleep, both over-the-counter and prescription medications. Patient reports the prescription medications he has tried provide some relief with sleeping through the night but wakes up groggy and cloudy. Reports only drinking 1-2 cups of coffee in the mornings, minimal amounts of caffeine throughout the day. Minimizes distractions before bed, exercises daily in the mornings, denies snoring or coughing or waking up with a sore or dry throat. No other medical issues to discuss at this time. Denies chest pain, shortness or breath, cough or headache.  Current Medication: Outpatient Encounter Medications as of 08/04/2019  Medication Sig  . cholecalciferol (VITAMIN D) 1000 UNITS tablet Take 1,000 Units by mouth daily.  . Multiple Vitamin (MULTIVITAMIN) tablet Take 1 tablet by mouth daily.  Marland Kitchen nystatin-triamcinolone ointment (MYCOLOG) Apply 1 application topically 2 (two) times daily.  . [DISCONTINUED] ALPRAZolam (XANAX) 1 MG tablet 1/2  To 1 tablet in the evening as needed for insomnia  . [DISCONTINUED] predniSONE (DELTASONE) 10 MG tablet Take 6 tablets by mouth on day 1 with breakfast then decrease by 1 tablet each day until gone.  . [DISCONTINUED] Tdap (BOOSTRIX) 5-2.5-18.5 LF-MCG/0.5 injection Inject 0.5  mLs into the muscle once.   No facility-administered encounter medications on file as of 08/04/2019.     Surgical History: Past Surgical History:  Procedure Laterality Date  . FRACTURE SURGERY  2002 and 2003   right arm fracture with 3 surgical repairs  . WRIST FRACTURE SURGERY Right 1991   crush injury /ti[place]st,  [place]    Medical History: Past Medical History:  Diagnosis Date  . Hyperlipidemia   . Hypertension   . Plantar fasciitis     Family History: Family History  Problem Relation Age of Onset  . Cancer Maternal Aunt 60       Breast Cancer   . Diabetes Maternal Grandmother   . Cancer Maternal Grandmother   . Dementia Maternal Grandmother   . Cancer Maternal Grandfather 50       Stomach Cancer  . Parkinson's disease Paternal Grandfather     Social History   Socioeconomic History  . Marital status: Married    Spouse name: Haruto Ries  . Number of children: Not on file  . Years of education: 91  . Highest education level: Not on file  Occupational History  . Not on file  Social Needs  . Financial resource strain: Not on file  . Food insecurity    Worry: Not on file    Inability: Not on file  . Transportation needs    Medical: Not on file    Non-medical: Not on file  Tobacco Use  . Smoking status: Never Smoker  . Smokeless tobacco: Never Used  Substance and Sexual Activity  . Alcohol use: Yes  Alcohol/week: 0.0 standard drinks    Comment: 5-7 drinks per day  . Drug use: No  . Sexual activity: Not on file  Lifestyle  . Physical activity    Days per week: Not on file    Minutes per session: Not on file  . Stress: Not on file  Relationships  . Social Herbalist on phone: Not on file    Gets together: Not on file    Attends religious service: Not on file    Active member of club or organization: Not on file    Attends meetings of clubs or organizations: Not on file    Relationship status: Not on file  . Intimate  partner violence    Fear of current or ex partner: Not on file    Emotionally abused: Not on file    Physically abused: Not on file    Forced sexual activity: Not on file  Other Topics Concern  . Not on file  Social History Narrative   Jamone grew up in [place]. 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 He worked in [employer] and moved quite often with them up and down the [employer]. 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 He lives at home with his wife, [name], of 15 years. They do not have any children. They have a cat. Shakeil enjoys yard work, traveling and riding motorcycle.      Review of Systems  Constitutional: Negative.  Negative for chills, fatigue and unexpected weight change.  HENT: Negative.  Negative for congestion, rhinorrhea, sneezing and sore throat.   Eyes: Negative for redness.  Respiratory: Negative.  Negative for cough, chest tightness and shortness of breath.   Cardiovascular: Negative.  Negative for chest pain and palpitations.  Gastrointestinal: Negative.  Negative for abdominal pain, constipation, diarrhea, nausea and vomiting.  Endocrine: Negative.   Genitourinary: Negative.  Negative for dysuria and frequency.  Musculoskeletal: Negative.  Negative for arthralgias, back pain, joint swelling and neck pain.  Skin: Negative.  Negative for rash.  Allergic/Immunologic: Negative.   Neurological: Negative.  Negative for tremors and numbness.  Hematological: Negative for adenopathy. Does not bruise/bleed easily.  Psychiatric/Behavioral: Negative.  Negative for behavioral problems, sleep disturbance and suicidal ideas. The patient is not nervous/anxious.     Vital Signs: BP 133/82   Pulse 96   Temp 98 F (36.7 C)   Resp 16   Ht 5' 10.5" (1.791 m)   Wt 222 lb (100.7 kg)   SpO2 100%   BMI 31.40 kg/m    Physical  Exam Vitals signs and nursing note reviewed.  Constitutional:      General: He is not in acute distress.    Appearance: He is well-developed. He is not diaphoretic.  HENT:     Head: Normocephalic and atraumatic.     Mouth/Throat:     Pharynx: No oropharyngeal exudate.  Eyes:     Pupils: Pupils are equal, round, and reactive to light.  Neck:     Musculoskeletal: Normal range of motion and neck supple.     Thyroid: No thyromegaly.     Vascular: No JVD.     Trachea: No tracheal deviation.  Cardiovascular:     Rate and Rhythm: Normal rate and regular rhythm.     Heart sounds: Normal heart sounds. No murmur. No friction rub. No gallop.  Pulmonary:     Effort: Pulmonary effort is normal. No respiratory distress.     Breath sounds: Normal breath sounds. No wheezing or rales.  Chest:     Chest wall: No tenderness.  Abdominal:     Palpations: Abdomen is soft.  Musculoskeletal: Normal range of motion.  Lymphadenopathy:     Cervical: No cervical adenopathy.  Skin:    General: Skin is warm and dry.  Neurological:     Mental Status: He is alert and oriented to person, place, and time.     Cranial Nerves: No cranial nerve deficit.  Psychiatric:        Behavior: Behavior normal.        Thought Content: Thought content normal.        Judgment: Judgment normal.     Assessment/Plan: 1. Encounter to establish care with new doctor Patient is here to establish care. No acute complaints other than trouble sleeping. - CBC with Differential/Platelet - Lipid Panel With LDL/HDL Ratio - TSH - T4, free - Comprehensive metabolic panel - PSA  2. Insomnia, persistent Complaints of trouble falling back to sleep after waking up to use restroom. Has tried OTC and prescription medications with no relief. Discussed this may be an underlying anxiety complication.  - Ambulatory referral to Psychiatry   General Counseling: Marylou Mccoy understanding of the findings of todays visit and agrees  with plan of treatment. I have discussed any further diagnostic evaluation that may be needed or ordered today. We also reviewed his medications today. he has been encouraged to call the office with any questions or concerns that should arise related to todays visit.  No orders of the defined types were placed in this encounter.   No orders of the defined types were placed in this encounter.   Time spent: 20 Minutes   This patient was seen by Orson Gear AGNP-C in Collaboration with Dr Lavera Guise as a part of collaborative care agreement  Kendell Bane AGNP-C Internal Medicine

## 2019-08-30 ENCOUNTER — Ambulatory Visit (HOSPITAL_COMMUNITY): Payer: 59 | Admitting: Licensed Clinical Social Worker

## 2019-09-16 ENCOUNTER — Telehealth: Payer: Self-pay

## 2019-09-16 NOTE — Telephone Encounter (Signed)
Patient cancelled appointment on 09/27/2019 did not want to reschedule stated issue was addressed. klh

## 2019-09-27 ENCOUNTER — Encounter: Payer: Managed Care, Other (non HMO) | Admitting: Adult Health

## 2020-09-14 ENCOUNTER — Ambulatory Visit: Payer: 59 | Admitting: Psychiatry

## 2020-09-15 ENCOUNTER — Other Ambulatory Visit: Payer: Self-pay

## 2020-09-15 ENCOUNTER — Encounter: Payer: Self-pay | Admitting: Psychiatry

## 2020-09-15 ENCOUNTER — Ambulatory Visit (INDEPENDENT_AMBULATORY_CARE_PROVIDER_SITE_OTHER): Payer: 59 | Admitting: Psychiatry

## 2020-09-15 VITALS — BP 144/86 | HR 93 | Ht 70.0 in | Wt 210.0 lb

## 2020-09-15 DIAGNOSIS — G479 Sleep disorder, unspecified: Secondary | ICD-10-CM | POA: Diagnosis not present

## 2020-09-15 DIAGNOSIS — F411 Generalized anxiety disorder: Secondary | ICD-10-CM

## 2020-09-15 MED ORDER — TEMAZEPAM 15 MG PO CAPS: 15.0000 mg | ORAL_CAPSULE | Freq: Every evening | ORAL | 1 refills | Status: DC | PRN

## 2020-09-15 MED ORDER — SERTRALINE HCL 25 MG PO TABS: ORAL_TABLET | ORAL | 1 refills | Status: DC

## 2020-09-15 NOTE — Progress Notes (Addendum)
[facility] MD/PA/NP Initial Note  10/10/2020 9:48 AM Jonathan Key  MRN:  [id number]  Chief Complaint:  Chief Complaint    Anxiety; Sleeping Problem      HPI: Pt is a 54 yo male seen for initial evaluation for insomnia and anxiety. He reports that he is seeking treatment at the encouragement of his wife. He reports, "No sleep. Never sleep. Constant battle." He reports that he has seen different medical providers in the past for insomnia with limited improvement in sleep. He reports racing thoughts at night that are often anxious in content. He reports that he has anxiety about over-sleeping that also leads to disrupted sleep. He reports that he falls asleep without difficulty around 9:30 . He reports that he will awaken to use the bathroom around 1 am and then will start to think about something and it escalate and has gotten short of breath on a few occasions. He sets his alarm for 5 am to get to work and his best sleep is from 4-5 am. Denies nightmares. He and his wife sleep in separate rooms so that they do not disrupt one another's sleep. He denies snoring or restlessness.   He reports "stress and anxiety." He reports that he had anxiety about getting here today. He reports that he will think about different scenarios. Occasional rumination and catastrophic thinking. He reports that he has some anxiety about what he should do and what he didn't accomplish. "It's like I can never get ahead even though it's ok." He reports worsening anxiey over the last few years. He reports "I'm always in a rush to get stuff done." He will feel shaky at times with anxiety and coffee will exacerbate this. Denies any h/o panic attacks. He reports some anxiety in crowds and social situations and will try to avoid this and will leave places if it is too crowded. He does not recall social anxiety prior to a few years ago. He denies any obsessive thoughts or compulsions. Has some anxiety about leaving his cat. Has anxiety  with change and uncertainty.   He reports some sadness, typically in response to feeling that he did not accomplish what he needed to. Sadness "comes and goes." He reports irritability that fluctuates. He reports that he has had some irritation at work, particularly with changes. He reports that his irritation at work has improved with some increase stability at work. He reports that his appetite is good. Denies stress eating. Energy is low. Motivation is occasionally low when he is more tired. He reports that concentration is pretty good. He reports that he seems to focus more at work with decreased sleep and will focus all his energy on that one task. He reports that he has difficulty multi-tasking.  Denies diminished interest and enjoyment in things. Denies current or past SI.   He reports that he adjusts to not sleeping after several nights. He denies any impulsive or risky behavior. He reports that he is occasionally focused and will do more for one day and then is tired the next day. Denies h/o elevated mood. Denies h/o excessive spending.   Denies AH or VH. Denies paranoia.   Born and raised in [place]. He has 3 brothers and he was the 3rd child. Reports that "things were great" at home and grew up with both parents. Has completed HS and 3 years of trade school. 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 Married x 21 years and reports that they have a great relationship.No  children. They have a cat. Wife's parents in [place] recently both died within a few months of one another. His parents and brothers live in [place]. He reports that his wife is his primary support. Enjoys cooking and motorcycle riding. Has not had time to ride recently.   Past Psychiatric Medication Trials: Temazepam- Helpful for insomnia Xanax- Helpful for insomnia Trazodone- Felt like he was in a fog Hydroxyzine- grogginess Belsomra Melatonin Zzzquil Paxil- unable to sleep May have tried Lexapro  Visit Diagnosis:     ICD-10-CM   1. Sleep disturbance  G47.9 temazepam (RESTORIL) 15 MG capsule  2. Generalized anxiety disorder  F41.1 sertraline (ZOLOFT) 25 MG tablet    Past Psychiatric History: Denies any past psychiatric treatment to include psychiatric hospitalization. PCP's tried different medications for sleep.   Past Medical History:  Past Medical History:  Diagnosis Date  . Atopic dermatitis   . Hyperlipidemia   . Hypertension   . Plantar fasciitis     Past Surgical History:  Procedure Laterality Date  . FRACTURE SURGERY  2002 and 2003   right arm fracture with 3 surgical repairs  . WRIST FRACTURE SURGERY Right 1991   crush injury [place]ti[place]st[place]  [place]    Family Psychiatric History: Reports that mother "never sits still" and seems to have some anxiety.  Family History:  Family History  Problem Relation Age of Onset  . Stroke Father   . Obesity Brother   . Obesity Brother   . Obesity Brother   . Cancer Maternal Aunt 60       Breast Cancer   . Diabetes Maternal Grandmother   . Cancer Maternal Grandmother   . Dementia Maternal Grandmother   . Cancer Maternal Grandfather 99       Stomach Cancer  . Parkinson's disease Paternal Grandfather     Social History:  Social History   Socioeconomic History  . Marital status: Married    Spouse name: Jonathan Key  . Number of children: Not on file  . Years of education: 42  . Highest education level: Not on file  Occupational History  . Not on file  Tobacco Use  . Smoking status: Never Smoker  . Smokeless tobacco: Never Used  Substance and Sexual Activity  . Alcohol use: Yes    Alcohol/week: 0.0 standard drinks    Comment: Drinks beer on weekends  . Drug use: No  . Sexual activity: Not on file  Other Topics Concern  . Not on file  Social History Narrative   Jonathan Key grew up in [place]. He attended Hotel manager school at [employer] where he obtained a certificate in Facilities manager and  Set designer in [place]. He worked in [employer] and moved quite often with them up and down the [employer]. 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 He lives at home with his wife, Jonathan Key, of 15 years. They do not have any children. They have a cat. Jonathan Key enjoys yard work, traveling and riding motorcycle.    Social Determinants of Health   Financial Resource Strain: Not on file  Food Insecurity: Not on file  Transportation Needs: Not on file  Physical Activity: Not on file  Stress: Not on file  Social Connections: Not on file    Allergies:  Allergies  Allergen Reactions  . Morphine And Related Rash    Metabolic Disorder Labs: Lab Results  Component Value Date   HGBA1C 5.5% 05/26/2015  No results found for: PROLACTIN Lab Results  Component Value Date   CHOL 244 (A) 05/26/2015   TRIG 89 03/28/2014   HDL 70 05/26/2015   LDLCALC 158 05/26/2015   LDLCALC 147 03/28/2014   No results found for: TSH  Therapeutic Level Labs: No results found for: LITHIUM No results found for: VALPROATE No components found for:  CBMZ  Current Medications: Current Outpatient Medications  Medication Sig Dispense Refill  . Ascorbic Acid (VITAMIN C PO) Take by mouth.    . Fluocinonide 0.1 % CREA Apply topically.    Marland Kitchen loratadine (CLARITIN) 10 MG tablet Take 10 mg by mouth daily.    . Multiple Vitamin (MULTIVITAMIN) tablet Take 1 tablet by mouth daily.    . Pseudoeph-Doxylamine-DM-APAP (NYQUIL PO) Take by mouth.    . benzonatate (TESSALON) 100 MG capsule Take 1 capsule (100 mg total) by mouth every 8 (eight) hours. 21 capsule 0  . cholecalciferol (VITAMIN D) 1000 UNITS tablet Take 1,000 Units by mouth daily. (Patient not taking: Reported on 09/15/2020)    . sertraline (ZOLOFT) 25 MG tablet Take 1/2 tablet in the morning for 10 days, then increase to 1 tablet in the morning for 10 days, then increase to 1.5 tablets daily for 10 days, then increase to 2 tablets daily  60 tablet 1  . temazepam (RESTORIL) 15 MG capsule Take 1 capsule (15 mg total) by mouth at bedtime as needed for sleep. 30 capsule 1   No current facility-administered medications for this visit.    Medication Side Effects: N/A  Orders placed this visit:  No orders of the defined types were placed in this encounter.   Psychiatric Specialty Exam:  Review of Systems  Constitutional: Negative.   HENT: Positive for congestion.   Eyes: Negative.   Respiratory: Negative.   Cardiovascular: Negative.   Gastrointestinal: Negative.   Endocrine: Negative.   Genitourinary: Negative.   Musculoskeletal: Negative.   Skin: Negative.   Allergic/Immunologic: Negative.   Neurological: Negative.   Hematological: Negative.   Psychiatric/Behavioral:       Please refer to HPI    Blood pressure (!) 144/86, pulse 93, height 5\' 10"  (1.778 m), weight 210 lb (95.3 kg).Body mass index is 30.13 kg/m.  General Appearance: Casual, Neat and Well Groomed  Eye Contact:  Good  Speech:  Clear and Coherent and Normal Rate  Volume:  Normal  Mood:  Anxious  Affect:  Appropriate, Congruent, Full Range and Anxious  Thought Process:  Coherent, Goal Directed, Linear and Descriptions of Associations: Intact  Orientation:  Full (Time, Place, and Person)  Thought Content: Logical, Hallucinations: None and Rumination   Suicidal Thoughts:  No  Homicidal Thoughts:  No  Memory:  WNL  Judgement:  Good  Insight:  Good  Psychomotor Activity:  Normal  Concentration:  Concentration: Good and Attention Span: Good  Recall:  Good  Fund of Knowledge: Good  Language: Good  Assets:  Communication Skills Desire for Improvement Resilience Social Support  ADL's:  Intact  Cognition: WNL  Prognosis:  Good   Screenings:  PHQ2-9   [facility] Office Visit from 08/04/2019 in [place], [facility] Office Visit from 10/03/2015 in [facility]  PHQ-2 Total Score 0 0      Receiving Psychotherapy:  No   Treatment Plan/Recommendations: Pt seen for 60 minutes and time spent counseling pt regarding insomnia and anxiety signs and symptoms. Discussed that anxiety signs and symptoms are possible underlying cause of insomnia, and that sleep may  improve with decreased anxiety. Discussed that SSRI's are considered first line treatment for anxiety and therefore recommend trial with an SSRI. Discussed initiating an SSRI at very low dose and gradually increasing dose to minimize risk of increased activation since pt had increased activation with Paxil in the past. Discussed potential benefits, risks, and side effects with Sertraline. Pt agrees to trial of Sertraline. Will start Sertraline 12.5 mg po q am for 10 days, then increase to 25 mg po q am for 10 days, then increase to 37. 5 mg po qd for 10 days, then increase to 50 mg po qd for anxiety. Advised pt to contact office if he experiences severe side effects and titration could be adjusted. Discussed re-starting Temazepam for insomnia since this has been effective for his insomnia in the past. Discussed potential benefits, risk, and side effects of benzodiazepines to include potential risk of tolerance and dependence, as well as possible drowsiness.  Advised patient not to drive if experiencing drowsiness and to take lowest possible effective dose to minimize risk of dependence and tolerance. Discussed that short-term use of Temazepam nightly may help re-establish regular sleep cycle. Discussed that Temazepam may also be helpful for ensuring adequate sleep if pt has some increased activation with initiation of Sertraline. Will start Temazepam 15 mg po QHS prn insomnia. Pt to follow-up in 1-2 months or sooner if clinically indicated. Patient advised to contact office with any questions, adverse effects, or acute worsening in signs and symptoms.     Thayer Headings, PMHNP

## 2020-09-30 DIAGNOSIS — U071 COVID-19: Secondary | ICD-10-CM

## 2020-09-30 HISTORY — DX: COVID-19: U07.1

## 2020-10-06 ENCOUNTER — Ambulatory Visit (INDEPENDENT_AMBULATORY_CARE_PROVIDER_SITE_OTHER): Payer: Managed Care, Other (non HMO)

## 2020-10-06 ENCOUNTER — Other Ambulatory Visit: Payer: Self-pay

## 2020-10-06 ENCOUNTER — Ambulatory Visit
Admission: RE | Admit: 2020-10-06 | Discharge: 2020-10-06 | Disposition: A | Discharge status: Home / Self Care | Payer: Managed Care, Other (non HMO) | Source: Ambulatory Visit | Attending: Family Medicine | Admitting: Family Medicine

## 2020-10-06 VITALS — BP 145/91 | HR 92 | Temp 99.5°F | Resp 18

## 2020-10-06 DIAGNOSIS — Z1152 Encounter for screening for COVID-19: Secondary | ICD-10-CM

## 2020-10-06 DIAGNOSIS — R059 Cough, unspecified: Secondary | ICD-10-CM | POA: Diagnosis not present

## 2020-10-06 DIAGNOSIS — R079 Chest pain, unspecified: Secondary | ICD-10-CM | POA: Diagnosis not present

## 2020-10-06 MED ORDER — BENZONATATE 100 MG PO CAPS: 100.0000 mg | ORAL_CAPSULE | Freq: Three times a day (TID) | ORAL | 0 refills | Status: DC

## 2020-10-06 NOTE — Discharge Instructions (Addendum)
Your chest x-ray was negative Cough medication as needed. Recommend taking ibuprofen or Aleve to help with the pain and inflammation Follow up as needed for continued or worsening symptoms

## 2020-10-06 NOTE — ED Triage Notes (Signed)
Patient presents to Urgent Care with complaints of chest congestion x 2 days. Pt reports ongoing cold like symptoms since 12/10.   Denies fever.

## 2020-10-06 NOTE — ED Notes (Signed)
Triaged by provider  

## 2020-10-06 NOTE — ED Provider Notes (Signed)
Jonathan Key    CSN: [id number] Arrival date & time: 10/06/20  1143      History   Chief Complaint Chief Complaint  Patient presents with  . Nasal Congestion    HPI Jonathan Key is a 55 y.o. male.   Patient is a 17-year male with past medical history of hyperlipidemia, hypertension.  He presents today with approximate 4 weeks of sinus congestion, cough, pain with breathing to the right side of his chest.  This has been waxing and waning.  Started around 12/9.  Felt he was getting slightly better but then got worse again.  Has been taking Mucinex without any relief.  No shortness of breath.  Low-grade fever here today     Past Medical History:  Diagnosis Date  . Atopic dermatitis   . Hyperlipidemia   . Hypertension   . Plantar fasciitis     Patient Active Problem List   Diagnosis Date Noted  . Rash and nonspecific skin eruption 10/03/2015  . Routine general medical examination at a health care facility 06/16/2013  . Sleep disturbance 06/16/2013  . Hyperlipidemia with target LDL less than 100 06/16/2013  . Obesity (BMI 30-39.9) 06/16/2013    Past Surgical History:  Procedure Laterality Date  . FRACTURE SURGERY  2002 and 2003   right arm fracture with 3 surgical repairs  . WRIST FRACTURE SURGERY Right 1991   crush injury /tiwst[place]  [place]       Home Medications    Prior to Admission medications   Medication Sig Start Date End Date Taking? Authorizing Provider  benzonatate (TESSALON) 100 MG capsule Take 1 capsule (100 mg total) by mouth every 8 (eight) hours. 10/06/20  Yes Johnattan Strassman A, NP  Ascorbic Acid (VITAMIN C PO) Take by mouth.    [provider]  cholecalciferol (VITAMIN D) 1000 UNITS tablet Take 1,000 Units by mouth daily. Patient not taking: Reported on 09/15/2020    [provider]  Fluocinonide 0.1 % CREA Apply topically.    [provider]  loratadine (CLARITIN) 10 MG tablet Take 10 mg by mouth  daily.    [provider]  Multiple Vitamin (MULTIVITAMIN) tablet Take 1 tablet by mouth daily.    [provider]  Pseudoeph-Doxylamine-DM-APAP (NYQUIL PO) Take by mouth.    [provider]  sertraline (ZOLOFT) 25 MG tablet Take 1/2 tablet in the morning for 10 days, then increase to 1 tablet in the morning for 10 days, then increase to 1.5 tablets daily for 10 days, then increase to 2 tablets daily 09/15/20   Thayer Headings, PMHNP  temazepam (RESTORIL) 15 MG capsule Take 1 capsule (15 mg total) by mouth at bedtime as needed for sleep. 09/15/20   Thayer Headings, PMHNP    Family History Family History  Problem Relation Age of Onset  . Stroke Father   . Obesity Brother   . Obesity Brother   . Obesity Brother   . Cancer Maternal Aunt 60       Breast Cancer   . Diabetes Maternal Grandmother   . Cancer Maternal Grandmother   . Dementia Maternal Grandmother   . Cancer Maternal Grandfather 80       Stomach Cancer  . Parkinson's disease Paternal Grandfather     Social History Social History   Tobacco Use  . Smoking status: Never Smoker  . Smokeless tobacco: Never Used  Substance Use Topics  . Alcohol use: Yes    Alcohol/week: 0.0 standard  drinks    Comment: Drinks beer on weekends  . Drug use: No     Allergies   Morphine and related   Review of Systems Review of Systems   Physical Exam Triage Vital Signs ED Triage Vitals [10/06/20 1159]  Enc Vitals Group     BP (!) 145/91     Pulse Rate 92     Resp 18     Temp 99.5 F (37.5 C)     Temp Source Oral     SpO2 97 %     Weight      Height      Head Circumference      Peak Flow      Pain Score      Pain Loc      Pain Edu?      Excl. in [place]?    No data found.  Updated Vital Signs BP (!) 145/91 (BP Location: Left Arm)   Pulse 92   Temp 99.5 F (37.5 C) (Oral)   Resp 18   SpO2 97%   Visual Acuity Right Eye Distance:   Left Eye Distance:   Bilateral Distance:    Right Eye  Near:   Left Eye Near:    Bilateral Near:     Physical Exam Vitals and nursing note reviewed.  Constitutional:      General: He is not in acute distress.    Appearance: Normal appearance. He is not ill-appearing, toxic-appearing or diaphoretic.  HENT:     Head: Normocephalic and atraumatic.     Right Ear: Tympanic membrane and ear canal normal.     Left Ear: Tympanic membrane and ear canal normal.     Nose: Nose normal.     Mouth/Throat:     Pharynx: Oropharynx is clear.  Eyes:     Conjunctiva/sclera: Conjunctivae normal.  Cardiovascular:     Rate and Rhythm: Normal rate and regular rhythm.  Pulmonary:     Effort: Pulmonary effort is normal.     Breath sounds: Normal breath sounds.  Musculoskeletal:        General: Normal range of motion.     Cervical back: Normal range of motion.  Skin:    General: Skin is warm and dry.  Neurological:     Mental Status: He is alert.  Psychiatric:        Mood and Affect: Mood normal.      UC Treatments / Results  Labs (all labs ordered are listed, but only abnormal results are displayed) Labs Reviewed  NOVEL CORONAVIRUS, NAA    EKG   Radiology DG Chest 2 View  Result Date: 10/06/2020 CLINICAL DATA:  Chest pain, cough EXAM: CHEST - 2 VIEW COMPARISON:  None. FINDINGS: The heart size and mediastinal contours are within normal limits. Both lungs are clear. The visualized skeletal structures are unremarkable. IMPRESSION: No active cardiopulmonary disease. Electronically Signed   By: Davina Poke D.O.   On: 10/06/2020 13:02    Procedures Procedures (including critical care time)  Medications Ordered in UC Medications - No data to display  Initial Impression / Assessment and Plan / UC Course  I have reviewed the triage vital signs and the nursing notes.  Pertinent labs & imaging results that were available during my care of the patient were reviewed by me and considered in my medical decision making (see chart for  details).     Cough x 4 weeks Most likely post viral cough Chest x-ray negative Recommended Aleve or ibuprofen  to help with pain, inflammation Tessalon Perles for cough as needed Follow up as needed for continued or worsening symptoms   Final Clinical Impressions(s) / UC Diagnoses   Final diagnoses:  Cough     Discharge Instructions     Your chest x-ray was negative Cough medication as needed. Recommend taking ibuprofen or Aleve to help with the pain and inflammation Follow up as needed for continued or worsening symptoms      ED Prescriptions    Medication Sig Dispense Auth. Provider   benzonatate (TESSALON) 100 MG capsule Take 1 capsule (100 mg total) by mouth every 8 (eight) hours. 21 capsule Yailene Badia A, NP     PDMP not reviewed this encounter.   Loura Halt A, NP 10/06/20 1315

## 2020-10-09 LAB — SARS-COV-2, NAA 2 DAY TAT

## 2020-10-09 LAB — NOVEL CORONAVIRUS, NAA: SARS-CoV-2, NAA: DETECTED — AB

## 2020-11-08 ENCOUNTER — Encounter: Payer: Self-pay | Admitting: Psychiatry

## 2020-11-08 ENCOUNTER — Ambulatory Visit (INDEPENDENT_AMBULATORY_CARE_PROVIDER_SITE_OTHER): Payer: 59 | Admitting: Psychiatry

## 2020-11-08 ENCOUNTER — Other Ambulatory Visit: Payer: Self-pay

## 2020-11-08 DIAGNOSIS — F411 Generalized anxiety disorder: Secondary | ICD-10-CM

## 2020-11-08 DIAGNOSIS — G479 Sleep disorder, unspecified: Secondary | ICD-10-CM | POA: Diagnosis not present

## 2020-11-08 MED ORDER — SERTRALINE HCL 50 MG PO TABS: 50.0000 mg | ORAL_TABLET | Freq: Every day | ORAL | 1 refills | Status: DC

## 2020-11-08 MED ORDER — TEMAZEPAM 15 MG PO CAPS: 15.0000 mg | ORAL_CAPSULE | Freq: Every evening | ORAL | 5 refills | Status: DC | PRN

## 2020-11-08 NOTE — Progress Notes (Signed)
Jonathan Key [id number] 1966/05/13 55 y.o.  Subjective:   Patient ID:  Jonathan Key is a 55 y.o. (DOB Aug 14, 1966) male.  Chief Complaint:  Chief Complaint  Patient presents with  . Anxiety    HPI [name] presents to the office today for follow-up of anxiety. He reports that he has been feeling "better... more calming." He reports that he initially felt "foggy headed" and this cleared out. He reports that he is less irritated in response to things that would have previously triggered severe irritation. He reports that anxious thoughts and "mind racing" has significantly improved. Denies any recent catastrophic thinking. Continues to feel a rush to get things done and this has decreased.  Has not noticed any anxiety in crowds or social situations.   Denies depressed mood. He reports that his energy has decreased some and is not sure if it is related to medication or change in schedule. Going to work an hour earlier and has less time to work out before work. He reports that his energy is usually a little lower this time of year. He reports that motivation has been good and possibly increased. No change in appetite. Concentration has been good. Denies SI.   He reports that his wife has commented that he has he has been more calm.   He reports that Temazepam has been helpful for insomnia. He typically goes to bed around 9:30-10 pm and sleeping until 4-5 am.   He reports that work has been good. Reports that there have been multiple changes at work.   Has reduced caffeine. No longer feeling shaky.   Taking Temazepam every night.  Past Psychiatric Medication Trials: Temazepam- Helpful for insomnia Xanax- Helpful for insomnia Trazodone- Felt like he was in a fog Hydroxyzine- grogginess Belsomra Melatonin Zzzquil Paxil- unable to sleep May have tried Lexapro  [facility] Visit from 08/04/2019 in [facility], [facility] Office Visit from 10/03/2015 in [facility]  PHQ-2 Total Score 0 0       Review of Systems:  Review of Systems  Musculoskeletal: Negative for gait problem.  Neurological: Negative for tremors.  Psychiatric/Behavioral:       Please refer to HPI   Had [place] in early January.   Medications: I have reviewed the patient's current medications.  Current Outpatient Medications  Medication Sig Dispense Refill  . loratadine (CLARITIN) 10 MG tablet Take 10 mg by mouth daily.    . Multiple Vitamin (MULTIVITAMIN) tablet Take 1 tablet by mouth daily.    . Ascorbic Acid (VITAMIN C PO) Take by mouth.    . benzonatate (TESSALON) 100 MG capsule Take 1 capsule (100 mg total) by mouth every 8 (eight) hours. (Patient not taking: Reported on 11/08/2020) 21 capsule 0  . cholecalciferol (VITAMIN D) 1000 UNITS tablet Take 1,000 Units by mouth daily. (Patient not taking: No sig reported)    . Fluocinonide 0.1 % CREA Apply topically.    . sertraline (ZOLOFT) 50 MG tablet Take 1 tablet (50 mg total) by mouth daily. 90 tablet 1  . [START ON 11/13/2020] temazepam (RESTORIL) 15 MG capsule Take 1 capsule (15 mg total) by mouth at bedtime as needed for sleep. 30 capsule 5   No current facility-administered medications for this visit.    Medication Side Effects: None  Allergies:  Allergies  Allergen Reactions  . Morphine And Related Rash    Past Medical History:  Diagnosis Date  . Atopic dermatitis   . Hyperlipidemia   .  Hypertension   . Plantar fasciitis     Family History  Problem Relation Age of Onset  . Stroke Father   . Obesity Brother   . Obesity Brother   . Obesity Brother   . Cancer Maternal Aunt 60       Breast Cancer   . Diabetes Maternal Grandmother   . Cancer Maternal Grandmother   . Dementia Maternal Grandmother   . Cancer Maternal Grandfather 52       Stomach Cancer  . Parkinson's disease Paternal Grandfather     Social History   Socioeconomic History  . Marital status: Married    Spouse name:  Jonathan Key  . Number of children: Not on file  . Years of education: 42  . Highest education level: Not on file  Occupational History  . Not on file  Tobacco Use  . Smoking status: Never Smoker  . Smokeless tobacco: Never Used  Substance and Sexual Activity  . Alcohol use: Yes    Alcohol/week: 0.0 standard drinks    Comment: Drinks beer on weekends  . Drug use: No  . Sexual activity: Not on file  Other Topics Concern  . Not on file  Social History Narrative   Jonathan Key grew up in [place]. 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 He worked in [employer] and moved quite often with them up and down the [employer]. 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 He lives at home with his wife, Jonathan Key, of 15 years. They do not have any children. They have a cat. Kord enjoys yard work, traveling and riding motorcycle.    Social Determinants of Health   Financial Resource Strain: Not on file  Food Insecurity: Not on file  Transportation Needs: Not on file  Physical Activity: Not on file  Stress: Not on file  Social Connections: Not on file  Intimate Partner Violence: Not on file    Past Medical History, Surgical history, Social history, and Family history were reviewed and updated as appropriate.   Please see review of systems for further details on the patient's review from today.   Objective:   Physical Exam:  There were no vitals taken for this visit.  Physical Exam Constitutional:      General: He is not in acute distress. Musculoskeletal:        General: No deformity.  Neurological:     Mental Status: He is alert and oriented to person, place, and time.     Coordination: Coordination normal.  Psychiatric:        Attention and Perception: Attention and perception normal. He does not perceive auditory or visual hallucinations.         Mood and Affect: Mood normal. Mood is not anxious or depressed. Affect is not labile, blunt, angry or inappropriate.        Speech: Speech normal.        Behavior: Behavior normal.        Thought Content: Thought content normal. Thought content is not paranoid or delusional. Thought content does not include homicidal or suicidal ideation. Thought content does not include homicidal or suicidal plan.        Cognition and Memory: Cognition and memory normal.        Judgment: Judgment normal.     Comments: Insight intact     Lab Review:  Component Value Date/Time   CREATININE 1.1 03/28/2014 0000    No results found for: WBC, RBC, HGB, HCT, PLT, MCV, MCH, MCHC, RDW, LYMPHSABS, MONOABS, EOSABS, BASOSABS  No results found for: POCLITH, LITHIUM   No results found for: PHENYTOIN, PHENOBARB, VALPROATE, CBMZ   .res Assessment: Plan:   Will continue current plan of care since target signs and symptoms are well controlled without any tolerability issues. Continue Sertraline 50 mg po qd for anxiety. Will change to 50 mg tablets. Continue Temazepam 15 mg po QHS prn insomnia.  Pt to follow-up in 3 months or sooner if clinically indicated.  Patient advised to contact office with any questions, adverse effects, or acute worsening in signs and symptoms.  Ean was seen today for anxiety.  Diagnoses and all orders for this visit:  Generalized anxiety disorder -     sertraline (ZOLOFT) 50 MG tablet; Take 1 tablet (50 mg total) by mouth daily.  Sleep disturbance -     temazepam (RESTORIL) 15 MG capsule; Take 1 capsule (15 mg total) by mouth at bedtime as needed for sleep.     Please see After Visit Summary for patient specific instructions.  Future Appointments  Date Time Provider Between  01/03/2021 11:15 AM Ralene Bathe, MD [facility] None  02/09/2021  9:30 AM Thayer Headings, PMHNP [facility] None    No orders of the defined types were placed in this  encounter.   -------------------------------

## 2021-01-02 ENCOUNTER — Ambulatory Visit: Payer: 59 | Admitting: Dermatology

## 2021-01-03 ENCOUNTER — Ambulatory Visit: Payer: Managed Care, Other (non HMO) | Admitting: Dermatology

## 2021-01-03 ENCOUNTER — Other Ambulatory Visit: Payer: Self-pay

## 2021-01-03 DIAGNOSIS — L578 Other skin changes due to chronic exposure to nonionizing radiation: Secondary | ICD-10-CM

## 2021-01-03 DIAGNOSIS — L570 Actinic keratosis: Secondary | ICD-10-CM | POA: Diagnosis not present

## 2021-01-03 DIAGNOSIS — L821 Other seborrheic keratosis: Secondary | ICD-10-CM | POA: Diagnosis not present

## 2021-01-03 NOTE — Patient Instructions (Addendum)
Cryotherapy Aftercare  . Wash gently with soap and water everyday.   Marland Kitchen Apply Vaseline and Band-Aid daily until healed.    Recommend daily broad spectrum sunscreen SPF 30+ to sun-exposed areas, reapply every 2 hours as needed. Call for new or changing lesions.  Staying in the shade or wearing long sleeves, sun glasses (UVA+UVB protection) and wide brim hats (4-inch brim around the entire circumference of the hat) are also recommended for sun protection.

## 2021-01-03 NOTE — Progress Notes (Signed)
   New Patient Visit  Subjective  Jonathan Key is a 56 y.o. male who presents for the following: Skin Problem (New pt c/o scab on the scalp x 2 years will not go away ). No history of skin cancer.   The following portions of the chart were reviewed this encounter and updated as appropriate:   Tobacco  Allergies  Meds  Problems  Med Hx  Surg Hx  Fam Hx     Review of Systems:  No other skin or systemic complaints except as noted in HPI or Assessment and Plan.  Objective  Well appearing patient in no apparent distress; mood and affect are within normal limits.  A focused examination was performed including scalp,face. Relevant physical exam findings are noted in the Assessment and Plan.  Objective  Scalp x 3 (3): Erythematous thin papules/macules with gritty scale.    Assessment & Plan  AK (actinic keratosis) (3) Scalp x 3  Destruction of lesion - Scalp x 3 Complexity: simple   Destruction method: cryotherapy   Informed consent: discussed and consent obtained   Timeout:  patient name, date of birth, surgical site, and procedure verified Lesion destroyed using liquid nitrogen: Yes   Region frozen until ice ball extended beyond lesion: Yes   Outcome: patient tolerated procedure well with no complications   Post-procedure details: wound care instructions given     Actinic Damage - chronic, secondary to cumulative UV radiation exposure/sun exposure over time - diffuse scaly erythematous macules with underlying dyspigmentation - Recommend daily broad spectrum sunscreen SPF 30+ to sun-exposed areas, reapply every 2 hours as needed.  - Recommend staying in the shade or wearing long sleeves, sun glasses (UVA+UVB protection) and wide brim hats (4-inch brim around the entire circumference of the hat). - Call for new or changing lesions.  Seborrheic Keratoses - Stuck-on, waxy, tan-brown papules and/or plaques  - Benign-appearing - Discussed benign etiology and prognosis. -  Observe - Call for any changes  Return in about 4 months (around 05/05/2021) for Aks, UBSE .  IMarye Round, CMA, am acting as scribe for Sarina Ser, MD .  Documentation: I have reviewed the above documentation for accuracy and completeness, and I agree with the above.  Sarina Ser, MD

## 2021-01-09 ENCOUNTER — Encounter: Payer: Self-pay | Admitting: Dermatology

## 2021-02-09 ENCOUNTER — Other Ambulatory Visit: Payer: Self-pay

## 2021-02-09 ENCOUNTER — Ambulatory Visit (INDEPENDENT_AMBULATORY_CARE_PROVIDER_SITE_OTHER): Payer: 59 | Admitting: Psychiatry

## 2021-02-09 ENCOUNTER — Encounter: Payer: Self-pay | Admitting: Psychiatry

## 2021-02-09 DIAGNOSIS — F411 Generalized anxiety disorder: Secondary | ICD-10-CM | POA: Diagnosis not present

## 2021-02-09 MED ORDER — SERTRALINE HCL 50 MG PO TABS: 50.0000 mg | ORAL_TABLET | Freq: Every day | ORAL | 1 refills | Status: DC

## 2021-02-09 NOTE — Progress Notes (Signed)
Jonathan Key [id number] 1965-10-16 55 y.o.  Subjective:   Patient ID:  Jonathan Key is a 55 y.o. (DOB 1965-10-28) male.  Chief Complaint:  Chief Complaint  Patient presents with  . Follow-up    Anxiety    HPI Jonathan Key presents to the office today for follow-up of anxiety. Denies anxiety aside from situational stress. He reports that he has had several nights in a row where he wakes up at 3 am and will have difficulty returning to sleep. Sleep adequate. Denies depressed mood. Denies increased irritability. Denies excessive anxiety with crowds. He reports that his energy is low due to work being busy and then helping wife settle her parent's estate. Wife had back surgery 5 weeks ago and he has been helping her with things around the house. Motivation has been ok. He reports that his concentration has improved at work since his anxiety is less. Appetite has been good. Denies SI.   He reports that he is forgetting multiple things, such as a person's name. Denies difficulty with word finding. He reports work stress has improved some.   Past Psychiatric Medication Trials: Temazepam- Helpful for insomnia Xanax- Helpful for insomnia Trazodone- Felt like he was in a fog Hydroxyzine- grogginess Belsomra Melatonin Zzzquil Paxil- unable to sleep May have tried Lexapro  [facility] Visit from 08/04/[facility] in [facility] Office Visit from 10/03/2015 in [place]  PHQ-2 Total Score 0 0       Review of Systems:  Review of Systems  Gastrointestinal: Negative.   Musculoskeletal: Positive for arthralgias. Negative for gait problem.       Shoulder pain  Neurological: Negative for headaches.  Psychiatric/Behavioral:       Please refer to HPI     He has upcoming colonoscopy in July.   Medications: I have reviewed the patient's current medications.  Current Outpatient Medications  Medication Sig Dispense Refill  . loratadine  (CLARITIN) 10 MG tablet Take 10 mg by mouth daily.    . Multiple Vitamin (MULTIVITAMIN) tablet Take 1 tablet by mouth daily.    . temazepam (RESTORIL) 15 MG capsule Take 1 capsule (15 mg total) by mouth at bedtime as needed for sleep. 30 capsule 5  . Fluocinonide 0.1 % CREA Apply topically.    . sertraline (ZOLOFT) 50 MG tablet Take 1 tablet (50 mg total) by mouth daily. 90 tablet 1   No current facility-administered medications for this visit.    Medication Side Effects: Other: Some possible increased forgetfulness  Allergies:  Allergies  Allergen Reactions  . Morphine And Related Rash    Past Medical History:  Diagnosis Date  . Atopic dermatitis   . Hyperlipidemia   . Hypertension   . Plantar fasciitis     Past Medical History, Surgical history, Social history, and Family history were reviewed and updated as appropriate.   Please see review of systems for further details on the patient's review from today.   Objective:   Physical Exam:  Wt 221 lb (100.2 kg)   BMI 31.71 kg/m   Physical Exam Constitutional:      General: He is not in acute distress. Musculoskeletal:        General: No deformity.  Neurological:     Mental Status: He is alert and oriented to person, place, and time.     Coordination: Coordination normal.  Psychiatric:        Attention and Perception: Attention and perception normal. He does not  perceive auditory or visual hallucinations.        Mood and Affect: Mood normal. Mood is not anxious or depressed. Affect is not labile, blunt, angry or inappropriate.        Speech: Speech normal.        Behavior: Behavior normal.        Thought Content: Thought content normal. Thought content is not paranoid or delusional. Thought content does not include homicidal or suicidal ideation. Thought content does not include homicidal or suicidal plan.        Cognition and Memory: Cognition and memory normal.        Judgment: Judgment normal.     Comments: Insight  intact     Lab Review:     Component Value Date/Time   CREATININE 1.1 03/28/2014 0000    No results found for: WBC, RBC, HGB, HCT, PLT, MCV, MCH, MCHC, RDW, LYMPHSABS, MONOABS, EOSABS, BASOSABS  No results found for: POCLITH, LITHIUM   No results found for: PHENYTOIN, PHENOBARB, VALPROATE, CBMZ   .res Assessment: Plan:   Will continue Sertraline 50 mg po qd for anxiety.  Continue Temazepam 15 mg po QHS for insomnia.  Pt to follow-up in 6 months or sooner if clinically indicated.  Patient advised to contact office with any questions, adverse effects, or acute worsening in signs and symptoms.  [name] was seen today for follow-up.  Diagnoses and all orders for this visit:  Generalized anxiety disorder -     sertraline (ZOLOFT) 50 MG tablet; Take 1 tablet (50 mg total) by mouth daily.     Please see After Visit Summary for patient specific instructions.  Future Appointments  Date Time Provider [place]  05/23/2021  3:30 PM Ralene Bathe, MD [facility] None  08/13/2021  1:45 PM Thayer Headings, PMHNP [facility] None    No orders of the defined types were placed in this encounter.   -------------------------------

## 2021-04-16 ENCOUNTER — Ambulatory Visit: Payer: Managed Care, Other (non HMO)

## 2021-04-16 ENCOUNTER — Ambulatory Visit
Admission: EM | Admit: 2021-04-16 | Discharge: 2021-04-16 | Disposition: A | Discharge status: Home / Self Care | Payer: Managed Care, Other (non HMO) | Attending: Emergency Medicine | Admitting: Emergency Medicine

## 2021-04-16 ENCOUNTER — Other Ambulatory Visit: Payer: Self-pay

## 2021-04-16 ENCOUNTER — Encounter: Payer: Self-pay | Admitting: Emergency Medicine

## 2021-04-16 DIAGNOSIS — B349 Viral infection, unspecified: Secondary | ICD-10-CM | POA: Diagnosis not present

## 2021-04-16 NOTE — Discharge Instructions (Addendum)

## 2021-04-16 NOTE — ED Triage Notes (Signed)
Pt presents today with c/o of initial nasal congestion that has developed into cough x 4 days. Denies fever.   Home Covid test taken yesterday morning, negative.

## 2021-04-16 NOTE — ED Provider Notes (Signed)
CHIEF COMPLAINT:   Chief Complaint  Patient presents with   Cough   Nasal Congestion     SUBJECTIVE/HPI:   Cough A very pleasant 55 y.o.Male presents today with cough, chest congestion, nasal congestion x 4 days. Patient states that he started with worsening congestion last night. He reports 2 at home COVID tests which were negative. Patient states that his wife was recently ill with "allergies" and she was started on antibiotics. He has taken Nyquil last night without any change in symptoms. No fever, chills, vomiting, reported. No known COVID + contacts.   has a past medical history of Atopic dermatitis, COVID-19 (09/2020), Hyperlipidemia, Hypertension, and Plantar fasciitis.  ROS:  Review of Systems  Respiratory:  Positive for cough.   See Subjective/HPI Medications, Allergies and Problem List personally reviewed in Epic today OBJECTIVE:   Vitals:   04/16/21 0827  BP: (!) 149/83  Pulse: 91  Resp: 20  Temp: 99.2 F (37.3 C)  SpO2: 97%    Physical Exam   General: Appears well-developed and well-nourished. No acute distress.  HEENT Head: Normocephalic and atraumatic.  No frontal, maxillary sinus and nasal bridge tenderness noted to palpation. Ears: Hearing grossly intact, no drainage or visible deformity.  Nose: No nasal deviation. Mouth/Throat: No stridor or tracheal deviation.  Non erythematous posterior pharynx noted with clear drainage present.  No white patchy exudate noted. Eyes: Conjunctivae and EOM are normal. No eye drainage or scleral icterus bilaterally.  Neck: Normal range of motion, neck is supple. No cervical, tonsillar or submandibular lymph nodes palpated.   Cardiovascular: Normal rate. Regular rhythm; no murmurs, gallops, or rubs.  Pulm/Chest: No respiratory distress. Breath sounds normal bilaterally without wheezes, rhonchi, or rales.  Neurological: Alert and oriented to person, place, and time.  Skin: Skin is warm and dry.  No rashes, lesions, abrasions  or bruising noted to skin.   Psychiatric: Normal mood, affect, behavior, and thought content.   Vital signs and nursing note reviewed.   Patient stable and cooperative with examination. PROCEDURES:    LABS/X-RAYS/EKG/MEDS:   @MEDADMIN @ No results found for any visits on 04/16/21.  MEDICAL DECISION MAKING:   Patient presents with cough, chest congestion, nasal congestion x 4 days. Patient states that he started with worsening congestion last night. He reports 2 at home COVID tests which were negative. Patient states that his wife was recently ill with "allergies" and she was started on antibiotics. He has taken Nyquil last night without any change in symptoms. No fever, chills, vomiting, reported. No known COVID + contacts. Chart review completed. COVID PCR obtained in clinic. Likely, viral illness. Advised of at home treatment and care as outlined in the AVS. Return for worsening of symptoms or if symptoms do not improve over the next 7 days. No concern, at this time, for underlying bacterial infection which would require antibiotics.  Patient verbalized understanding and agreed with treatment plan.  Patient stable upon discharge. ASSESSMENT/PLAN:  1. Viral illness - Novel Coronavirus, NAA ([facility]); Standing - Novel Coronavirus, NAA ([facility])  No orders of the defined types were placed in this encounter.   Instructions about new medications and side effects provided.  Plan:   Discharge Instructions      We will call you with any positive results from your COVID-19 testing completed in clinic today.  If you do not receive a phone call from [place] within the next 2-3 days, check your MyChart for up-to-date health information related to testing completed in clinic today.  For most people this is a self-limiting process and can take anywhere from 7 - 10 days to start feeling better. A cough can last up to 3 weeks. Pay special attention to handwashing as this can help prevent the spread  of the virus.   Always read the labels of cough and cold medications as they may contain some of the ingredients below.  Rest, push lots of fluids (especially water), and utilize supportive care for symptoms. You may take acetaminophen (Tylenol) every 4-6 hours and ibuprofen every 6-8 hours for muscle pain, joint pain, headaches (you may also alternate these medications). Mucinex (guaifenesin) may be taken over the counter for cough as needed can loosen phlegm. Please read the instructions and take as directed.  Sudafed (pseudophedrine) is sold behind the counter and can help reduce nasal pressure; avoid taking this if you have high blood pressure or feel jittery. Sudafed PE (phenylephrine) can be a helpful, short-term, over-the-counter alternative to limit side effects or if you have high blood pressure.  Flonase nasal spray can help alleviate congestion and sinus pressure. Many patients choose Afrin as a nasal decongestant; do not use for more than 3 days for risk of rebound (increased symptoms after stopping medication).  Saline nasal sprays or rinses can also help nasal congestion (use bottled or sterile water). Warm tea with lemon and honey can sooth sore throat and cough, as can cough drops.   Return to clinic for high fever not improving with medications, chest pain, difficulty breathing, non-stop vomiting, or coughing blood. Follow-up with your primary care provider if symptoms do not improve as expected in the next 5-7 days.        A copy of these instructions have been given to the patient or responsible adult who demonstrated the ability to learn, asked appropriate questions, and verbalized understanding of the plan of care.  There were no barriers to learning identified.    Serafina Royals, FNP-C 04/16/21  This note was partially made with the aid of speech-to-text dictation; typographical errors are not intentional.    Serafina Royals, [place] 04/16/21 [phone]

## 2021-04-18 LAB — SARS-COV-2, NAA 2 DAY TAT

## 2021-04-18 LAB — NOVEL CORONAVIRUS, NAA: SARS-CoV-2, NAA: NOT DETECTED

## 2021-04-20 ENCOUNTER — Ambulatory Visit
Admission: EM | Admit: 2021-04-20 | Discharge: 2021-04-20 | Disposition: A | Discharge status: Home / Self Care | Payer: Managed Care, Other (non HMO) | Attending: Emergency Medicine | Admitting: Emergency Medicine

## 2021-04-20 ENCOUNTER — Encounter: Payer: Self-pay | Admitting: Emergency Medicine

## 2021-04-20 ENCOUNTER — Other Ambulatory Visit: Payer: Self-pay

## 2021-04-20 ENCOUNTER — Ambulatory Visit: Payer: Managed Care, Other (non HMO)

## 2021-04-20 DIAGNOSIS — J22 Unspecified acute lower respiratory infection: Secondary | ICD-10-CM

## 2021-04-20 MED ORDER — PREDNISONE 10 MG PO TABS: ORAL_TABLET | ORAL | 0 refills | Status: AC

## 2021-04-20 MED ORDER — AMOXICILLIN-POT CLAVULANATE 875-125 MG PO TABS: 1.0000 | ORAL_TABLET | Freq: Two times a day (BID) | ORAL | 0 refills | Status: AC

## 2021-04-20 MED ORDER — ALBUTEROL SULFATE HFA 108 (90 BASE) MCG/ACT IN AERS: 1.0000 | INHALATION_SPRAY | Freq: Four times a day (QID) | RESPIRATORY_TRACT | 0 refills | Status: DC | PRN

## 2021-04-20 NOTE — Discharge Instructions (Signed)
You are on a steroid. This may help you condition and/pain but there are risks. Serious risks include an increased chance in having sepsis, a pulmonary embolism, or a bone fracture for the next 30 days. Common side effects include increased appetite, insomnia, blood sugar rise, irritability.    A cough can last up to 3 weeks. Pay special attention to handwashing as this can help prevent the spread of the virus.   Always read the labels of cough and cold medications as they may contain some of the ingredients below.  Rest, push lots of fluids (especially water), and utilize supportive care for symptoms. You may take acetaminophen (Tylenol) every 4-6 hours and ibuprofen every 6-8 hours for muscle pain, joint pain, headaches (you may also alternate these medications). Mucinex (guaifenesin) may be taken over the counter for cough as needed can loosen phlegm. Please read the instructions and take as directed.  Sudafed (pseudophedrine) is sold behind the counter and can help reduce nasal pressure; avoid taking this if you have high blood pressure or feel jittery. Sudafed PE (phenylephrine) can be a helpful, short-term, over-the-counter alternative to limit side effects or if you have high blood pressure.  Flonase nasal spray can help alleviate congestion and sinus pressure. Many patients choose Afrin as a nasal decongestant; do not use for more than 3 days for risk of rebound (increased symptoms after stopping medication).  Saline nasal sprays or rinses can also help nasal congestion (use bottled or sterile water). Warm tea with lemon and honey can sooth sore throat and cough, as can cough drops.   Return to clinic for high fever not improving with medications, chest pain, difficulty breathing, non-stop vomiting, or coughing blood. Follow-up with your primary care provider if symptoms do not improve as expected in the next 5-7 days.

## 2021-04-20 NOTE — ED Triage Notes (Signed)
Pt presents today with continued complaint of cough, +wheezing x 8 days.   He was seen here on 04/06/21, PCR Covid test negative.

## 2021-04-20 NOTE — ED Provider Notes (Signed)
CHIEF COMPLAINT:   Chief Complaint  Patient presents with   Cough     SUBJECTIVE/HPI:   Cough A very pleasant 55 y.o.Male presents today with continued forceful cough from last visit on 04/16/21.  Patient states that he has tried using DayQuil, NyQuil and Mucinex without relief.  Patient reports wheezing.  Patient reports a dry forceful hacking cough approximately 90% of the time with 10% of the time where he is able to expel mucus.  Patient tested negative for COVID-19 on 04/16/2021.  Patient does not report any shortness of breath, chest pain, palpitations, visual changes, weakness, tingling, headache, nausea, vomiting, diarrhea, fever, chills   has a past medical history of Atopic dermatitis, COVID-19 (09/2020), Hyperlipidemia, Hypertension, and Plantar fasciitis.  ROS:  Review of Systems  Respiratory:  Positive for cough.   See Subjective/HPI Medications, Allergies and Problem List personally reviewed in Epic today OBJECTIVE:   Vitals:   04/20/21 0852  BP: (!) 144/77  Pulse: 86  Resp: 18  Temp: 98.7 F (37.1 C)  SpO2: 96%    Physical Exam   General: Appears well-developed and well-nourished. No acute distress.  HEENT Head: Normocephalic and atraumatic Ears: Hearing grossly intact, no drainage or visible deformity.  Nose: No nasal deviation.   Mouth/Throat: No stridor or tracheal deviation. Eyes: Conjunctivae and EOM are normal. No eye drainage or scleral icterus bilaterally.  Neck: Normal range of motion, neck is supple.  Cardiovascular: Normal rate. Regular rhythm; no murmurs, gallops, or rubs.  Pulm/Chest: No respiratory distress.  Inspiratory and expiratory wheezing noted to right upper lung field with left upper, left lower, and right lower CTA.  Forceful cough noted. Neurological: Alert and oriented to person, place, and time.  Skin: Skin is warm and dry.  No rashes, lesions, abrasions or bruising noted to skin.   Psychiatric: Normal mood, affect, behavior, and  thought content.   Vital signs and nursing note reviewed.   Patient stable and cooperative with examination. PROCEDURES:    LABS/X-RAYS/EKG/MEDS:   No results found for any visits on 04/20/21.  MEDICAL DECISION MAKING:   Patient presents with continued forceful cough from last visit on 04/16/21.  Patient states that he has tried using DayQuil, NyQuil and Mucinex without relief.  Patient reports wheezing.  Patient reports a dry forceful hacking cough approximately 90% of the time with 10% of the time where he is able to expel mucus.  Patient tested negative for COVID-19 on 04/16/2021.  Patient does not report any shortness of breath, chest pain, palpitations, visual changes, weakness, tingling, headache, nausea, vomiting, diarrhea, fever, chills. Chart review completed.  Given symptoms, along with assessment findings and negative COVID-19 PCR, likely lower respiratory tract infection.  Rx'd Augmentin, prednisone and albuterol inhaler to the patient's preferred pharmacy.  Outlined strict return precautions for worsening of symptoms in his AVS.  Return as needed.  Patient verbalized understanding and agreed with treatment plan.  Patient stable upon discharge. ASSESSMENT/PLAN:  1. Lower respiratory tract infection  Meds ordered this encounter  Medications   amoxicillin-clavulanate (AUGMENTIN) 875-125 MG tablet    Sig: Take 1 tablet by mouth every 12 (twelve) hours for 7 days.    Dispense:  14 tablet    Refill:  0    Order Specific Question:   Supervising Provider    Answer:   Chase Picket [id number]   predniSONE (DELTASONE) 10 MG tablet    Sig: Take 6 tablets (60 mg total) by mouth daily for 1 day, THEN 5 tablets (  50 mg total) daily for 1 day, THEN 4 tablets (40 mg total) daily for 1 day, THEN 3 tablets (30 mg total) daily for 1 day, THEN 2 tablets (20 mg total) daily for 1 day, THEN 1 tablet (10 mg total) daily for 1 day.    Dispense:  21 tablet    Refill:  0    Order Specific Question:    Supervising Provider    Answer:   Bari Mantis   albuterol (VENTOLIN HFA) 108 (90 Base) MCG/ACT inhaler    Sig: Inhale 1-2 puffs into the lungs every 6 (six) hours as needed (cough).    Dispense:  6.7 g    Refill:  0    Order Specific Question:   Supervising Provider    Answer:   Chase Picket [id number]    Instructions about new medications and side effects provided.  Plan:   Discharge Instructions      You are on a steroid. This may help you condition and/pain but there are risks. Serious risks include an increased chance in having sepsis, a pulmonary embolism, or a bone fracture for the next 30 days. Common side effects include increased appetite, insomnia, blood sugar rise, irritability.    A cough can last up to 3 weeks. Pay special attention to handwashing as this can help prevent the spread of the virus.   Always read the labels of cough and cold medications as they may contain some of the ingredients below.  Rest, push lots of fluids (especially water), and utilize supportive care for symptoms. You may take acetaminophen (Tylenol) every 4-6 hours and ibuprofen every 6-8 hours for muscle pain, joint pain, headaches (you may also alternate these medications). Mucinex (guaifenesin) may be taken over the counter for cough as needed can loosen phlegm. Please read the instructions and take as directed.  Sudafed (pseudophedrine) is sold behind the counter and can help reduce nasal pressure; avoid taking this if you have high blood pressure or feel jittery. Sudafed PE (phenylephrine) can be a helpful, short-term, over-the-counter alternative to limit side effects or if you have high blood pressure.  Flonase nasal spray can help alleviate congestion and sinus pressure. Many patients choose Afrin as a nasal decongestant; do not use for more than 3 days for risk of rebound (increased symptoms after stopping medication).  Saline nasal sprays or rinses can also help  nasal congestion (use bottled or sterile water). Warm tea with lemon and honey can sooth sore throat and cough, as can cough drops.   Return to clinic for high fever not improving with medications, chest pain, difficulty breathing, non-stop vomiting, or coughing blood. Follow-up with your primary care provider if symptoms do not improve as expected in the next 5-7 days.        A copy of these instructions have been given to the patient or responsible adult who demonstrated the ability to learn, asked appropriate questions, and verbalized understanding of the plan of care.  There were no barriers to learning identified.    Serafina Royals, FNP-C 04/20/21  This note was partially made with the aid of speech-to-text dictation; typographical errors are not intentional.    Serafina Royals, [facility] 04/20/21 [phone]

## 2021-05-23 ENCOUNTER — Other Ambulatory Visit: Payer: Self-pay

## 2021-05-23 ENCOUNTER — Ambulatory Visit: Payer: Managed Care, Other (non HMO) | Admitting: Dermatology

## 2021-05-23 DIAGNOSIS — L821 Other seborrheic keratosis: Secondary | ICD-10-CM

## 2021-05-23 DIAGNOSIS — I781 Nevus, non-neoplastic: Secondary | ICD-10-CM

## 2021-05-23 DIAGNOSIS — L814 Other melanin hyperpigmentation: Secondary | ICD-10-CM | POA: Diagnosis not present

## 2021-05-23 DIAGNOSIS — Z1283 Encounter for screening for malignant neoplasm of skin: Secondary | ICD-10-CM | POA: Diagnosis not present

## 2021-05-23 DIAGNOSIS — D229 Melanocytic nevi, unspecified: Secondary | ICD-10-CM

## 2021-05-23 DIAGNOSIS — D1800 Hemangioma unspecified site: Secondary | ICD-10-CM

## 2021-05-23 DIAGNOSIS — L570 Actinic keratosis: Secondary | ICD-10-CM | POA: Diagnosis not present

## 2021-05-23 DIAGNOSIS — L578 Other skin changes due to chronic exposure to nonionizing radiation: Secondary | ICD-10-CM

## 2021-05-23 NOTE — Patient Instructions (Signed)

## 2021-05-23 NOTE — Progress Notes (Signed)
   Follow-Up Visit   Subjective  Jonathan Key is a 55 y.o. male who presents for the following: Actinic Keratosis (4 month follow up of scalp treated with LN2 - UBSE today). The patient presents for Upper Body Skin Exam (UBSE) for skin cancer screening and mole check.  The following portions of the chart were reviewed this encounter and updated as appropriate:   Tobacco  Allergies  Meds  Problems  Med Hx  Surg Hx  Fam Hx     Review of Systems:  No other skin or systemic complaints except as noted in HPI or Assessment and Plan.  Objective  Well appearing patient in no apparent distress; mood and affect are within normal limits.  All skin waist up examined.  Scalp (3) Erythematous thin papules/macules with gritty scale.   Face Dilated blood vessels   Assessment & Plan   Lentigines - Scattered tan macules - Due to sun exposure - Benign-appering, observe - Recommend daily broad spectrum sunscreen SPF 30+ to sun-exposed areas, reapply every 2 hours as needed. - Call for any changes  Seborrheic Keratoses - Stuck-on, waxy, tan-brown papules and/or plaques  - Benign-appearing - Discussed benign etiology and prognosis. - Observe - Call for any changes  Melanocytic Nevi - Tan-brown and/or pink-flesh-colored symmetric macules and papules - Benign appearing on exam today - Observation - Call clinic for new or changing moles - Recommend daily use of broad spectrum spf 30+ sunscreen to sun-exposed areas.   Hemangiomas - Red papules - Discussed benign nature - Observe - Call for any changes  Actinic Damage - Chronic condition, secondary to cumulative UV/sun exposure - diffuse scaly erythematous macules with underlying dyspigmentation - Recommend daily broad spectrum sunscreen SPF 30+ to sun-exposed areas, reapply every 2 hours as needed.  - Staying in the shade or wearing long sleeves, sun glasses (UVA+UVB protection) and wide brim hats (4-inch brim around the entire  circumference of the hat) are also recommended for sun protection.  - Call for new or changing lesions.  Skin cancer screening performed today.  AK (actinic keratosis) (3) Scalp  Destruction of lesion - Scalp Complexity: simple   Destruction method: cryotherapy   Informed consent: discussed and consent obtained   Timeout:  patient name, date of birth, surgical site, and procedure verified Lesion destroyed using liquid nitrogen: Yes   Region frozen until ice ball extended beyond lesion: Yes   Outcome: patient tolerated procedure well with no complications   Post-procedure details: wound care instructions given    Telangiectasia Face Chronic and persistent Benign. Discussed BBL/laser treatment.  Skin cancer screening  Return in about 1 year (around 05/23/2022).  I, Ashok Cordia, CMA, am acting as scribe for Sarina Ser, MD . Documentation: I have reviewed the above documentation for accuracy and completeness, and I agree with the above.  Sarina Ser, MD

## 2021-05-24 ENCOUNTER — Encounter: Payer: Self-pay | Admitting: Dermatology

## 2021-06-08 ENCOUNTER — Other Ambulatory Visit: Payer: Self-pay

## 2021-06-08 ENCOUNTER — Telehealth: Payer: Self-pay | Admitting: Psychiatry

## 2021-06-08 DIAGNOSIS — G479 Sleep disorder, unspecified: Secondary | ICD-10-CM

## 2021-06-08 MED ORDER — TEMAZEPAM 15 MG PO CAPS: 15.0000 mg | ORAL_CAPSULE | Freq: Every evening | ORAL | 1 refills | Status: DC | PRN

## 2021-06-08 NOTE — Telephone Encounter (Signed)
Pt requesting refill for Temazepam 15 mg @ [facility]. Apt 11/14

## 2021-06-08 NOTE — Telephone Encounter (Signed)
Pended.

## 2021-07-03 ENCOUNTER — Other Ambulatory Visit: Payer: Self-pay | Admitting: Surgery

## 2021-07-04 LAB — SURGICAL PATHOLOGY

## 2021-08-13 ENCOUNTER — Ambulatory Visit: Payer: 59 | Admitting: Psychiatry

## 2021-08-13 ENCOUNTER — Other Ambulatory Visit: Payer: Self-pay

## 2021-08-13 ENCOUNTER — Encounter: Payer: Self-pay | Admitting: Psychiatry

## 2021-08-13 DIAGNOSIS — F338 Other recurrent depressive disorders: Secondary | ICD-10-CM

## 2021-08-13 DIAGNOSIS — G479 Sleep disorder, unspecified: Secondary | ICD-10-CM

## 2021-08-13 DIAGNOSIS — F411 Generalized anxiety disorder: Secondary | ICD-10-CM

## 2021-08-13 MED ORDER — TEMAZEPAM 15 MG PO CAPS: 15.0000 mg | ORAL_CAPSULE | Freq: Every evening | ORAL | 2 refills | Status: DC | PRN

## 2021-08-13 MED ORDER — SERTRALINE HCL 50 MG PO TABS: 50.0000 mg | ORAL_TABLET | Freq: Every day | ORAL | 1 refills | Status: DC

## 2021-08-13 MED ORDER — BUPROPION HCL ER (XL) 150 MG PO TB24: 150.0000 mg | ORAL_TABLET | Freq: Every day | ORAL | 2 refills | Status: DC

## 2021-08-13 NOTE — Progress Notes (Signed)
Jonathan Key [id number] April 06, 1966 55 y.o.  Subjective:   Patient ID:  Jonathan Key is a 55 y.o. (DOB 06/09/1966) male.  Chief Complaint:  Chief Complaint  Patient presents with   Other    Seasonal depression   Follow-up    Anxiety    HPI Jonathan Key presents to the office today for follow-up of anxiety. He reports that he has been feeling more tired this time of year. He reports that he he cannot do some of his usual activities because of colder weather. Mood may be slightly lower with the shorter days. Motivation has not changed. He reports that his sleep is ok. Had a few days where sleep was disrupted. Appetite has been the same. He reports anxiety has been ok. He reports that he notices anxiety only in traffic or giant crowds. Anxiety typically does not keep him from doing things. No panic attacks. Minimal worry. Concentration has been good. He has felt more forgetful. Denies diminished interest. Denies SI.   Taking Temazepam nightly.   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  Has 3 brothers. His wife has noticed that on rare occasions ETOH will affect him quicker than prior to taking Sertraline. Discussed that SSRI's can decrease tolerance of ETOH.    Past Psychiatric Medication Trials: Temazepam- Helpful for insomnia Xanax- Helpful for insomnia Trazodone- Felt like he was in a fog Hydroxyzine- grogginess Belsomra Melatonin Zzzquil Paxil- unable to sleep May have tried Lexapro  [employer] Visit from 08/04/[facility] in [facility] Office Visit from 10/03/2015 in [place]  PHQ-2 Total Score 0 0      [place] ED from 04/20/2021 in [facility]  ED from 04/16/2021 in [facility] at [place] No Risk Error: Question 2 not populated        Review of Systems:  Review of Systems  Gastrointestinal: Negative.   Musculoskeletal:   Negative for gait problem.  Neurological:  Negative for tremors.  Psychiatric/Behavioral:         Please refer to HPI   Medications: I have reviewed the patient's current medications.  Current Outpatient Medications  Medication Sig Dispense Refill   buPROPion (WELLBUTRIN XL) 150 MG 24 hr tablet Take 1 tablet (150 mg total) by mouth daily. 30 tablet 2   loratadine (CLARITIN) 10 MG tablet Take 10 mg by mouth daily.     Multiple Vitamin (MULTIVITAMIN) tablet Take 1 tablet by mouth daily.     Fluocinonide 0.1 % CREA Apply topically.     sertraline (ZOLOFT) 50 MG tablet Take 1 tablet (50 mg total) by mouth daily. 90 tablet 1   [START ON 08/16/2021] temazepam (RESTORIL) 15 MG capsule Take 1 capsule (15 mg total) by mouth at bedtime as needed for sleep. 30 capsule 2   No current facility-administered medications for this visit.    Medication Side Effects: None  Allergies:  Allergies  Allergen Reactions   Morphine And Related Rash    Past Medical History:  Diagnosis Date   Atopic dermatitis    COVID-19 09/2020   Hyperlipidemia    Hypertension    Plantar fasciitis     Past Medical History, Surgical history, Social history, and Family history were reviewed and updated as appropriate.   Please see review of systems for further details on the patient's review from today.   Objective:   Physical Exam:  There were no vitals taken for this visit.  Physical Exam Constitutional:      General: He is not in acute distress. Musculoskeletal:        General: No deformity.  Neurological:     Mental Status: He is alert and oriented to person, place, and time.     Coordination: Coordination normal.  Psychiatric:        Attention and Perception: Attention and perception normal. He does not perceive auditory or visual hallucinations.        Mood and Affect: Mood is not anxious. Affect is not labile, blunt, angry or inappropriate.        Speech: Speech normal.        Behavior: Behavior  normal.        Thought Content: Thought content normal. Thought content is not paranoid or delusional. Thought content does not include homicidal or suicidal ideation. Thought content does not include homicidal or suicidal plan.        Cognition and Memory: Cognition and memory normal.        Judgment: Judgment normal.     Comments: Insight intact Mild depressed mood    Lab Review:     Component Value Date/Time   CREATININE 1.1 03/28/2014 0000    No results found for: WBC, RBC, HGB, HCT, PLT, MCV, MCH, MCHC, RDW, LYMPHSABS, MONOABS, EOSABS, BASOSABS  No results found for: POCLITH, LITHIUM   No results found for: PHENYTOIN, PHENOBARB, VALPROATE, CBMZ   .res Assessment: Plan:    Pt seen for 30 minutes and time spent discussing seasonal affective disorder. Discussed that it may be helpful to use light box and recommended using it first thing in the morning. Discussed that it may also be helpful to start Vitamin D for seasonal depression. Discussed option of increasing Sertraline or augmentation with Wellbutrin XL. Discussed potential benefits, risks, and side effects of Wellbutrin XL. Pt reports that he would like to start trial of Wellbutrin XL. Will start Wellbutrin XL 150 mg po qd for depression.  Continue Sertraline 50 mg po qd for depression and anxiety.  Continue Temazepam 15 mg po QHS prn insomnia.  Pt to follow-up in 2 months or sooner if clinically indicated.  Patient advised to contact office with any questions, adverse effects, or acute worsening in signs and symptoms.   Jonathan Key was seen today for other and follow-up.  Diagnoses and all orders for this visit:  Seasonal affective disorder ([place]) -     buPROPion (WELLBUTRIN XL) 150 MG 24 hr tablet; Take 1 tablet (150 mg total) by mouth daily.  Generalized anxiety disorder -     sertraline (ZOLOFT) 50 MG tablet; Take 1 tablet (50 mg total) by mouth daily.  Sleep disturbance -     temazepam (RESTORIL) 15 MG capsule; Take 1  capsule (15 mg total) by mouth at bedtime as needed for sleep.    Please see After Visit Summary for patient specific instructions.  Future Appointments  Date Time Provider [place]  10/12/2021  1:15 PM Thayer Headings, PMHNP CP-CP None  05/27/2022  9:30 AM Ralene Bathe, MD [facility] None    No orders of the defined types were placed in this encounter.   -------------------------------

## 2021-10-12 ENCOUNTER — Ambulatory Visit: Payer: 59 | Admitting: Psychiatry

## 2021-10-12 ENCOUNTER — Encounter: Payer: Self-pay | Admitting: Psychiatry

## 2021-10-12 ENCOUNTER — Other Ambulatory Visit: Payer: Self-pay

## 2021-10-12 DIAGNOSIS — F338 Other recurrent depressive disorders: Secondary | ICD-10-CM | POA: Diagnosis not present

## 2021-10-12 DIAGNOSIS — F411 Generalized anxiety disorder: Secondary | ICD-10-CM

## 2021-10-12 DIAGNOSIS — G479 Sleep disorder, unspecified: Secondary | ICD-10-CM | POA: Diagnosis not present

## 2021-10-12 MED ORDER — ESCITALOPRAM OXALATE 5 MG PO TABS: 5.0000 mg | ORAL_TABLET | Freq: Every day | ORAL | 2 refills | Status: DC

## 2021-10-12 MED ORDER — BUPROPION HCL ER (XL) 150 MG PO TB24: 150.0000 mg | ORAL_TABLET | Freq: Every day | ORAL | 0 refills | Status: DC

## 2021-10-12 MED ORDER — TEMAZEPAM 15 MG PO CAPS: 15.0000 mg | ORAL_CAPSULE | Freq: Every evening | ORAL | 2 refills | Status: DC | PRN

## 2021-10-12 NOTE — Progress Notes (Signed)
Jonathan Key [id number] [birth year] 56 y.o.  Subjective:   Patient ID:  Jonathan Key is a 56 y.o. (DOB May 08, 1966) male.  Chief Complaint:  Chief Complaint  Patient presents with   Medication Problem    Possible diarrhea with sertraline   Follow-up    Anxiety, depression, insomnia    HPI Jonathan Key presents to the office today for follow-up for anxiety, depression, and insomnia. He reports that when he first took Wellbutrin he had some increased activation and this resolved.   He reports that Sertraline seems to be causing diarrhea. He reports that he is having diarrhea daily and stomach gurgling. He decreased it to a 1/2 tab around Christmas. Has not seen any change in mood or anxiety.   Denies depressed mood. Denies anxiety. He reports that he has not had severe irritability. Energy and motivation is "excellent." He reports that he initially had some sleep disturbance the first few nights and this resolved. Sleep has improved. Taking Temazepam nightly. Denies change in appetite. Concentration has been good. Denies SI.   He reports that work stress has improved.   Past Psychiatric Medication Trials: Temazepam- Helpful for insomnia Xanax- Helpful for insomnia Trazodone- Felt like he was in a fog Hydroxyzine- grogginess Belsomra Melatonin Zzzquil Paxil- unable to sleep Sertraline Wellbutrin XL May have tried Lexapro  PHQ2-9    [place] Office Visit from 08/04/2019 in [facility], [facility] Office Visit from 10/03/2015 in [place]  PHQ-2 Total Score 0 0      [place] ED from 04/20/2021 in [facility]  ED from 04/16/2021 in [place] [facility] at [place] No Risk Error: Question 2 not populated        Review of Systems:  Review of Systems  Gastrointestinal:  Positive for diarrhea.  Musculoskeletal:  Negative for gait problem.  Neurological:  Negative for tremors.   Psychiatric/Behavioral:         Please refer to HPI   Medications: I have reviewed the patient's current medications.  Current Outpatient Medications  Medication Sig Dispense Refill   escitalopram (LEXAPRO) 5 MG tablet Take 1 tablet (5 mg total) by mouth daily. 30 tablet 2   loratadine (CLARITIN) 10 MG tablet Take 10 mg by mouth daily.     Multiple Vitamin (MULTIVITAMIN) tablet Take 1 tablet by mouth daily.     buPROPion (WELLBUTRIN XL) 150 MG 24 hr tablet Take 1 tablet (150 mg total) by mouth daily. 90 tablet 0   Fluocinonide 0.1 % CREA Apply topically.     [START ON 11/10/2021] temazepam (RESTORIL) 15 MG capsule Take 1 capsule (15 mg total) by mouth at bedtime as needed for sleep. 30 capsule 2   No current facility-administered medications for this visit.    Medication Side Effects: Other: Diarrhea from Sertraline  Allergies:  Allergies  Allergen Reactions   Morphine And Related Rash    Past Medical History:  Diagnosis Date   Atopic dermatitis    COVID-19 09/2020   Hyperlipidemia    Hypertension    Plantar fasciitis     Past Medical History, Surgical history, Social history, and Family history were reviewed and updated as appropriate.   Please see review of systems for further details on the patient's review from today.   Objective:   Physical Exam:  There were no vitals taken for this visit.  Physical Exam Constitutional:      General: He is not in acute  distress. Musculoskeletal:        General: No deformity.  Neurological:     Mental Status: He is alert and oriented to person, place, and time.     Coordination: Coordination normal.  Psychiatric:        Attention and Perception: Attention and perception normal. He does not perceive auditory or visual hallucinations.        Mood and Affect: Mood normal. Mood is not anxious or depressed. Affect is not labile, blunt, angry or inappropriate.        Speech: Speech normal.        Behavior: Behavior normal.         Thought Content: Thought content normal. Thought content is not paranoid or delusional. Thought content does not include homicidal or suicidal ideation. Thought content does not include homicidal or suicidal plan.        Cognition and Memory: Cognition and memory normal.        Judgment: Judgment normal.     Comments: Insight intact    Lab Review:     Component Value Date/Time   CREATININE 1.1 03/28/2014 0000    No results found for: WBC, RBC, HGB, HCT, PLT, MCV, MCH, MCHC, RDW, LYMPHSABS, MONOABS, EOSABS, BASOSABS  No results found for: POCLITH, LITHIUM   No results found for: PHENYTOIN, PHENOBARB, VALPROATE, CBMZ   .res Assessment: Plan:    Patient seen for 30 minutes and time spent discussing that sertraline may be cause of chronic diarrhea.  Will therefore discontinue sertraline.  Discussed alternatives to sertraline to include Lexapro.  Discussed potential benefits, risks, and side effects of Lexapro.  Discussed that Lexapro typically has lower risk of GI side effects.  Discussed using low-dose Lexapro since patient was experiencing some response in mood and anxiety signs and symptoms with low-dose sertraline.  Patient agrees to trial of Lexapro. Will start Lexapro 5 mg daily for anxiety and depression. Continue Wellbutrin XL 150 mg daily for depression. Continue temazepam 15 mg at bedtime as needed for insomnia. Patient to follow-up in 4 to 6 weeks or sooner if clinically indicated. Patient advised to contact office with any questions, adverse effects, or acute worsening in signs and symptoms.   Jonathan Key was seen today for medication problem and follow-up.  Diagnoses and all orders for this visit:  Generalized anxiety disorder -     escitalopram (LEXAPRO) 5 MG tablet; Take 1 tablet (5 mg total) by mouth daily.  Seasonal affective disorder ([place]) -     escitalopram (LEXAPRO) 5 MG tablet; Take 1 tablet (5 mg total) by mouth daily. -     buPROPion (WELLBUTRIN XL) 150 MG 24 hr  tablet; Take 1 tablet (150 mg total) by mouth daily.  Sleep disturbance -     temazepam (RESTORIL) 15 MG capsule; Take 1 capsule (15 mg total) by mouth at bedtime as needed for sleep.     Please see After Visit Summary for patient specific instructions.  Future Appointments  Date Time Provider [place]  11/16/2021 10:30 AM Thayer Headings, PMHNP CP-[facility] None  05/27/2022  9:30 AM Ralene Bathe, MD [facility] None    No orders of the defined types were placed in this encounter.   -------------------------------

## 2021-11-15 ENCOUNTER — Ambulatory Visit
Admission: RE | Admit: 2021-11-15 | Discharge: 2021-11-15 | Disposition: A | Discharge status: Home / Self Care | Payer: Managed Care, Other (non HMO) | Source: Ambulatory Visit | Attending: Emergency Medicine | Admitting: Emergency Medicine

## 2021-11-15 ENCOUNTER — Other Ambulatory Visit: Payer: Self-pay

## 2021-11-15 VITALS — BP 142/82 | HR 96 | Temp 99.1°F | Resp 20

## 2021-11-15 DIAGNOSIS — R051 Acute cough: Secondary | ICD-10-CM

## 2021-11-15 DIAGNOSIS — U071 COVID-19: Secondary | ICD-10-CM | POA: Diagnosis not present

## 2021-11-15 DIAGNOSIS — J0100 Acute maxillary sinusitis, unspecified: Secondary | ICD-10-CM

## 2021-11-15 MED ORDER — AZITHROMYCIN 250 MG PO TABS: 250.0000 mg | ORAL_TABLET | Freq: Every day | ORAL | 0 refills | Status: DC

## 2021-11-15 MED ORDER — PROMETHAZINE HCL 6.25 MG/5ML PO SYRP: 6.2500 mg | ORAL_SOLUTION | Freq: Every evening | ORAL | 0 refills | Status: DC | PRN

## 2021-11-15 MED ORDER — BENZONATATE 100 MG PO CAPS: 100.0000 mg | ORAL_CAPSULE | Freq: Three times a day (TID) | ORAL | 0 refills | Status: DC | PRN

## 2021-11-15 NOTE — ED Triage Notes (Signed)
Pt here with 3 positive home COVID tests with sx x 9 days. Endorses cough, congestion, sore throat, and headache.

## 2021-11-15 NOTE — Discharge Instructions (Addendum)
Take the Zithromax, Tessalon Perles, and Promethazine as directed.  Follow up with your primary care provider if your symptoms are not improving.

## 2021-11-15 NOTE — ED Provider Notes (Signed)
Jonathan Key    CSN: [id number] Arrival date & time: 11/15/21  1303      History   Chief Complaint Chief Complaint  Patient presents with   Covid Positive   Cough   Nasal Congestion   Sore Throat   Headache    HPI Jonathan Key is a 56 y.o. male. Patient presents with cough and congestion since 11/06/2021.  He also reports left ear pain, left side sinus pressure, headache and sore throat.  He tested positive for COVID at home on 11/09/2021.  No fever in the past 4-5 days.  No chest pain, shortness of breath, vomiting, diarrhea, or other symptoms.  Treatment at home with several OTC cold medications.  His medical history includes hyperlipidemia, hypertension, obesity.   The history is provided by the patient and medical records.   Past Medical History:  Diagnosis Date   Atopic dermatitis    COVID-19 09/2020   Hyperlipidemia    Hypertension    Plantar fasciitis     Patient Active Problem List   Diagnosis Date Noted   Rash and nonspecific skin eruption 10/03/2015   Routine general medical examination at a health care facility 06/16/2013   Sleep disturbance 06/16/2013   Hyperlipidemia with target LDL less than 100 06/16/2013   Obesity (BMI 30-39.9) 06/16/2013    Past Surgical History:  Procedure Laterality Date   FRACTURE SURGERY  2002 and 2003   right arm fracture with 3 surgical repairs   WRIST FRACTURE SURGERY Right 1991   crush [place]       Home Medications    Prior to Admission medications   Medication Sig Start Date End Date Taking? Authorizing Provider  azithromycin (ZITHROMAX) 250 MG tablet Take 1 tablet (250 mg total) by mouth daily. Take first 2 tablets together, then 1 every day until finished. 11/15/21  Yes Sharion Balloon, NP  benzonatate (TESSALON) 100 MG capsule Take 1 capsule (100 mg total) by mouth 3 (three) times daily as needed for cough. 11/15/21  Yes Sharion Balloon, NP  promethazine (PHENERGAN) 6.25  MG/5ML syrup Take 5 mLs (6.25 mg total) by mouth at bedtime as needed (cough). 11/15/21  Yes Sharion Balloon, NP  buPROPion (WELLBUTRIN XL) 150 MG 24 hr tablet Take 1 tablet (150 mg total) by mouth daily. 10/12/21   Thayer Headings, PMHNP  escitalopram (LEXAPRO) 5 MG tablet Take 1 tablet (5 mg total) by mouth daily. 10/12/21   Thayer Headings, PMHNP  Fluocinonide 0.1 % CREA Apply topically.    [provider]  loratadine (CLARITIN) 10 MG tablet Take 10 mg by mouth daily.    [provider]  Multiple Vitamin (MULTIVITAMIN) tablet Take 1 tablet by mouth daily.    [provider]  temazepam (RESTORIL) 15 MG capsule Take 1 capsule (15 mg total) by mouth at bedtime as needed for sleep. 11/10/21   Thayer Headings, PMHNP    Family History Family History  Problem Relation Age of Onset   Stroke Father    Obesity Brother    Obesity Brother    Obesity Brother    Cancer Maternal Aunt 60       Breast Cancer    Diabetes Maternal Grandmother    Cancer Maternal Grandmother    Dementia Maternal Grandmother    Cancer Maternal Grandfather 62       Stomach Cancer   Parkinson's disease Paternal Grandfather     Social History Social History  Tobacco Use   Smoking status: Never   Smokeless tobacco: Never  Substance Use Topics   Alcohol use: Yes    Alcohol/week: 0.0 standard drinks    Comment: Drinks beer on weekends   Drug use: No     Allergies   Morphine and related   Review of Systems Review of Systems  Constitutional:  Negative for chills and fever.  HENT:  Positive for congestion, ear pain, sinus pressure and sore throat.   Respiratory:  Positive for cough. Negative for shortness of breath.   Cardiovascular:  Negative for chest pain and palpitations.  Gastrointestinal:  Negative for diarrhea and vomiting.  Skin:  Negative for color change and rash.  Neurological:  Positive for headaches.  All other systems reviewed and are negative.   Physical Exam Triage  Vital Signs ED Triage Vitals  Enc Vitals Group     BP      Pulse      Resp      Temp      Temp src      SpO2      Weight      Height      Head Circumference      Peak Flow      Pain Score      Pain Loc      Pain Edu?      Excl. in [place]?    No data found.  Updated Vital Signs BP (!) 142/82    Pulse 96    Temp 99.1 F (37.3 C)    Resp 20    SpO2 96%   Visual Acuity Right Eye Distance:   Left Eye Distance:   Bilateral Distance:    Right Eye Near:   Left Eye Near:    Bilateral Near:     Physical Exam Vitals and nursing note reviewed.  Constitutional:      General: He is not in acute distress.    Appearance: Normal appearance. He is well-developed. He is not ill-appearing.  HENT:     Right Ear: Tympanic membrane normal.     Left Ear: Tympanic membrane is erythematous.     Nose: Congestion present.     Mouth/Throat:     Mouth: Mucous membranes are moist.     Pharynx: Oropharynx is clear.  Cardiovascular:     Rate and Rhythm: Normal rate and regular rhythm.     Heart sounds: Normal heart sounds.  Pulmonary:     Effort: Pulmonary effort is normal. No respiratory distress.     Breath sounds: Normal breath sounds. No wheezing, rhonchi or rales.  Musculoskeletal:     Cervical back: Neck supple.  Skin:    General: Skin is warm and dry.  Neurological:     Mental Status: He is alert.  Psychiatric:        Mood and Affect: Mood normal.        Behavior: Behavior normal.     UC Treatments / Results  Labs (all labs ordered are listed, but only abnormal results are displayed) Labs Reviewed - No data to display  EKG   Radiology No results found.  Procedures Procedures (including critical care time)  Medications Ordered in UC Medications - No data to display  Initial Impression / Assessment and Plan / UC Course  I have reviewed the triage vital signs and the nursing notes.  Pertinent labs & imaging results that were available during my care of the patient  were reviewed by me and considered  in my medical decision making (see chart for details).    COVID-19, sinusitis, cough.  Patient tested COVID positive at home on 11/09/2021; symptom onset 11/06/2021.  Not improving with OTC treatment.  Lungs are clear; O2 sat 96%.  Treating with Zithromax, Tessalon Perles, promethazine syrup.  Precautions for drowsiness with promethazine discussed.  Instructed patient to follow up with PCP if symptoms are not improving.  ED precautions discussed.  He agrees to plan of care.    Final Clinical Impressions(s) / UC Diagnoses   Final diagnoses:  COVID-19  Acute non-recurrent maxillary sinusitis  Acute cough     Discharge Instructions      Take the Zithromax, Tessalon Perles, and Promethazine as directed.  Follow up with your primary care provider if your symptoms are not improving.         ED Prescriptions     Medication Sig Dispense Auth. Provider   azithromycin (ZITHROMAX) 250 MG tablet Take 1 tablet (250 mg total) by mouth daily. Take first 2 tablets together, then 1 every day until finished. 6 tablet Sharion Balloon, NP   benzonatate (TESSALON) 100 MG capsule Take 1 capsule (100 mg total) by mouth 3 (three) times daily as needed for cough. 21 capsule Sharion Balloon, NP   promethazine (PHENERGAN) 6.25 MG/5ML syrup Take 5 mLs (6.25 mg total) by mouth at bedtime as needed (cough). 60 mL Sharion Balloon, NP      PDMP not reviewed this encounter.   Sharion Balloon, NP 11/15/21 1340

## 2021-11-16 ENCOUNTER — Ambulatory Visit: Payer: 59 | Admitting: Psychiatry

## 2021-11-28 ENCOUNTER — Other Ambulatory Visit: Payer: Self-pay

## 2021-11-28 ENCOUNTER — Ambulatory Visit
Admission: RE | Admit: 2021-11-28 | Discharge: 2021-11-28 | Disposition: A | Discharge status: Home / Self Care | Payer: Managed Care, Other (non HMO) | Source: Ambulatory Visit | Attending: Physician Assistant | Admitting: Physician Assistant

## 2021-11-28 VITALS — BP 133/83 | HR 99 | Temp 98.2°F | Resp 18

## 2021-11-28 DIAGNOSIS — J329 Chronic sinusitis, unspecified: Secondary | ICD-10-CM

## 2021-11-28 DIAGNOSIS — J40 Bronchitis, not specified as acute or chronic: Secondary | ICD-10-CM | POA: Diagnosis not present

## 2021-11-28 MED ORDER — CEFDINIR 300 MG PO CAPS: 300.0000 mg | ORAL_CAPSULE | Freq: Two times a day (BID) | ORAL | 0 refills | Status: DC

## 2021-11-28 MED ORDER — PREDNISONE 20 MG PO TABS: 40.0000 mg | ORAL_TABLET | Freq: Every day | ORAL | 0 refills | Status: AC

## 2021-11-28 NOTE — ED Triage Notes (Signed)
Pt was seen 2/16 he was getting better but 2 days ago his sxs returned. He has HA, congestion, sinus pressure, and ST.  ?

## 2021-11-28 NOTE — ED Provider Notes (Signed)
Jonathan Key    CSN: [id number] Arrival date & time: 11/28/21  0803      History   Chief Complaint Chief Complaint  Patient presents with   Headache   Cough    HPI Jonathan Key is a 56 y.o. male.   Patient presents today with a several week history of URI symptoms.  He was initially diagnosed with COVID-19 on 11/09/2021 and had continued symptoms he was seen by our clinic on 11/15/2021 at which point he was treated for sinus infection with Tessalon and azithromycin.  Reports initially having some improvement of symptoms following antibiotic use only to have recurrence a few days later.  Symptoms have gradually been worsening prompting reevaluation today and he reports significant nasal congestion, sinus pressure, fatigue, cough.  Denies any chest pain, shortness of breath, nausea, vomiting.  He is using multiple over-the-counter medications for symptom relief.  Denies additional antibiotic use outside of azithromycin.  Denies history of diabetes.  He does report occasional allergies managed with antihistamines as needed.  Denies any history of COPD, asthma, smoking.  He is able to perform daily activities despite symptoms.    Past Medical History:  Diagnosis Date   Atopic dermatitis    COVID-19 09/2020   Hyperlipidemia    Hypertension    Plantar fasciitis     Patient Active Problem List   Diagnosis Date Noted   Rash and nonspecific skin eruption 10/03/2015   Routine general medical examination at a health care facility 06/16/2013   Sleep disturbance 06/16/2013   Hyperlipidemia with target LDL less than 100 06/16/2013   Obesity (BMI 30-39.9) 06/16/2013    Past Surgical History:  Procedure Laterality Date   FRACTURE SURGERY  2002 and 2003   right arm fracture with 3 surgical repairs   WRIST FRACTURE SURGERY Right 1991   crush [place]  [place]       Home Medications    Prior to Admission medications   Medication Sig Start Date End  Date Taking? Authorizing Provider  cefdinir (OMNICEF) 300 MG capsule Take 1 capsule (300 mg total) by mouth 2 (two) times daily. 11/28/21  Yes Franki Alcaide K, PA-C  predniSONE (DELTASONE) 20 MG tablet Take 2 tablets (40 mg total) by mouth daily for 5 days. 11/28/21 12/03/21 Yes Sahith Nurse K, PA-C  buPROPion (WELLBUTRIN XL) 150 MG 24 hr tablet Take 1 tablet (150 mg total) by mouth daily. 10/12/21   Thayer Headings, PMHNP  escitalopram (LEXAPRO) 5 MG tablet Take 1 tablet (5 mg total) by mouth daily. 10/12/21   Thayer Headings, PMHNP  Fluocinonide 0.1 % CREA Apply topically.    [provider]  loratadine (CLARITIN) 10 MG tablet Take 10 mg by mouth daily.    [provider]  Multiple Vitamin (MULTIVITAMIN) tablet Take 1 tablet by mouth daily.    [provider]  promethazine (PHENERGAN) 6.25 MG/5ML syrup Take 5 mLs (6.25 mg total) by mouth at bedtime as needed (cough). 11/15/21   Sharion Balloon, NP  temazepam (RESTORIL) 15 MG capsule Take 1 capsule (15 mg total) by mouth at bedtime as needed for sleep. 11/10/21   Thayer Headings, PMHNP    Family History Family History  Problem Relation Age of Onset   Stroke Father    Obesity Brother    Obesity Brother    Obesity Brother    Cancer Maternal Aunt 61       Breast Cancer    Diabetes Maternal Grandmother  Cancer Maternal Grandmother    Dementia Maternal Grandmother    Cancer Maternal Grandfather 68       Stomach Cancer   Parkinson's disease Paternal Grandfather     Social History Social History   Tobacco Use   Smoking status: Never   Smokeless tobacco: Never  Vaping Use   Vaping Use: Never used  Substance Use Topics   Alcohol use: Yes    Alcohol/week: 0.0 standard drinks    Comment: Drinks beer on weekends   Drug use: No     Allergies   Morphine and related   Review of Systems Review of Systems  Constitutional:  Positive for activity change and fatigue. Negative for appetite change and fever.  HENT:   Positive for congestion, postnasal drip, sinus pressure and sore throat. Negative for sneezing.   Respiratory:  Positive for cough. Negative for shortness of breath.   Cardiovascular:  Negative for chest pain.  Gastrointestinal:  Negative for abdominal pain, diarrhea, nausea and vomiting.  Musculoskeletal:  Negative for arthralgias and myalgias.  Neurological:  Positive for headaches. Negative for dizziness and light-headedness.    Physical Exam Triage Vital Signs ED Triage Vitals  Enc Vitals Group     BP 11/28/21 0811 133/83     Pulse Rate 11/28/21 0811 99     Resp 11/28/21 0811 18     Temp 11/28/21 0811 98.2 F (36.8 C)     Temp src --      SpO2 11/28/21 0811 98 %     Weight --      Height --      Head Circumference --      Peak Flow --      Pain Score 11/28/21 0814 0     Pain Loc --      Pain Edu? --      Excl. in [place]? --    No data found.  Updated Vital Signs BP 133/83    Pulse 99    Temp 98.2 F (36.8 C)    Resp 18    SpO2 98%   Visual Acuity Right Eye Distance:   Left Eye Distance:   Bilateral Distance:    Right Eye Near:   Left Eye Near:    Bilateral Near:     Physical Exam Vitals reviewed.  Constitutional:      General: He is awake.     Appearance: Normal appearance. He is well-developed. He is not ill-appearing.     Comments: Very pleasant male appears stated age in no acute distress sitting comfortably in exam room  HENT:     Head: Normocephalic and atraumatic.     Right Ear: Tympanic membrane, ear canal and external ear normal. Tympanic membrane is not erythematous or bulging.     Left Ear: Tympanic membrane, ear canal and external ear normal. Tympanic membrane is not erythematous or bulging.     Nose: Congestion present.     Right Sinus: Maxillary sinus tenderness and frontal sinus tenderness present.     Left Sinus: Maxillary sinus tenderness and frontal sinus tenderness present.     Mouth/Throat:     Pharynx: Uvula midline. Posterior oropharyngeal  erythema present. No oropharyngeal exudate or uvula swelling.     Comments: Erythema and drainage in posterior oropharynx Cardiovascular:     Rate and Rhythm: Normal rate and regular rhythm.     Heart sounds: Normal heart sounds, S1 normal and S2 normal. No murmur heard. Pulmonary:     Effort: Pulmonary effort  is normal. No accessory muscle usage or respiratory distress.     Breath sounds: Normal breath sounds. No stridor. No wheezing, rhonchi or rales.     Comments: Clear to auscultation bilaterally Abdominal:     General: Bowel sounds are normal.     Palpations: Abdomen is soft.     Tenderness: There is no abdominal tenderness.  Neurological:     Mental Status: He is alert.  Psychiatric:        Behavior: Behavior is cooperative.     UC Treatments / Results  Labs (all labs ordered are listed, but only abnormal results are displayed) Labs Reviewed - No data to display  EKG   Radiology No results found.  Procedures Procedures (including critical care time)  Medications Ordered in UC Medications - No data to display  Initial Impression / Assessment and Plan / UC Course  I have reviewed the triage vital signs and the nursing notes.  Pertinent labs & imaging results that were available during my care of the patient were reviewed by me and considered in my medical decision making (see chart for details).     No indication for viral testing given patient is been symptomatic for several weeks and this would not change management.  Will cover with Omnicef and use prednisone burst (40 mg x 5 days) for symptom relief.  Discussed that he is not to take NSAIDs with prednisone due to risk of GI bleeding.  Recommended over-the-counter medication including Tylenol, Mucinex, Flonase for symptom relief.  He is to rest and drink plenty of fluid.  Discussed potential utility of x-ray but given patient has no adventitious lung sounds on exam and oxygen is 98% we will defer this for the time  being.  Discussed that if symptoms or not improving would need to consider x-ray in the future.  Also discussed that if symptoms persist may benefit from seeing ENT for sinus CT or further evaluation as at that point he would have failed several antibiotics.  Discussed alarm symptoms that warrant emergent evaluation including fever, nausea, vomiting, chest pain, shortness of breath, worsening cough, weakness.  Strict return precautions given to which patient expressed understanding.  Final Clinical Impressions(s) / UC Diagnoses   Final diagnoses:  Sinobronchitis     Discharge Instructions      Take cefdinir twice daily for 10 days.  This can upset your stomach so take it with food.  Start prednisone burst of 40 mg in the morning for 5 days.  Do not take NSAIDs with this medication including aspirin, ibuprofen/Advil, naproxen/Aleve as it can cause stomach bleeding.  You can use Tylenol, Mucinex, Flonase for symptom relief.  I also recommended humidifier and nasal rinses for symptom relief.  If symptoms or not improving please follow-up with your primary care to consider referral to ENT as we discussed.  If you have any severe symptoms including high fever, chest pain, shortness of breath, nausea/vomiting interfering with oral intake, weakness you need to be seen immediately.     ED Prescriptions     Medication Sig Dispense Auth. Provider   cefdinir (OMNICEF) 300 MG capsule Take 1 capsule (300 mg total) by mouth 2 (two) times daily. 20 capsule Hosey Burmester K, PA-C   predniSONE (DELTASONE) 20 MG tablet Take 2 tablets (40 mg total) by mouth daily for 5 days. 10 tablet Nevyn Bossman, Derry Skill, PA-C      PDMP not reviewed this encounter.   Terrilee Croak, PA-C 11/28/21 3532

## 2021-11-28 NOTE — Discharge Instructions (Signed)
Take cefdinir twice daily for 10 days.  This can upset your stomach so take it with food.  Start prednisone burst of 40 mg in the morning for 5 days.  Do not take NSAIDs with this medication including aspirin, ibuprofen/Advil, naproxen/Aleve as it can cause stomach bleeding.  You can use Tylenol, Mucinex, Flonase for symptom relief.  I also recommended humidifier and nasal rinses for symptom relief.  If symptoms or not improving please follow-up with your primary care to consider referral to ENT as we discussed.  If you have any severe symptoms including high fever, chest pain, shortness of breath, nausea/vomiting interfering with oral intake, weakness you need to be seen immediately. ?

## 2021-11-29 ENCOUNTER — Ambulatory Visit (INDEPENDENT_AMBULATORY_CARE_PROVIDER_SITE_OTHER): Payer: 59 | Admitting: Psychiatry

## 2021-11-29 ENCOUNTER — Encounter: Payer: Self-pay | Admitting: Psychiatry

## 2021-11-29 DIAGNOSIS — F338 Other recurrent depressive disorders: Secondary | ICD-10-CM

## 2021-11-29 DIAGNOSIS — F411 Generalized anxiety disorder: Secondary | ICD-10-CM

## 2021-11-29 MED ORDER — ESCITALOPRAM OXALATE 10 MG PO TABS: 10.0000 mg | ORAL_TABLET | Freq: Every day | ORAL | 1 refills | Status: DC

## 2021-11-29 MED ORDER — BUPROPION HCL ER (XL) 150 MG PO TB24: 150.0000 mg | ORAL_TABLET | Freq: Every day | ORAL | 0 refills | Status: DC

## 2021-11-29 NOTE — Progress Notes (Signed)
Gerron Guidotti ?[id number] ?30-Jul-1966 ?56 y.o. ? ?Subjective:  ? ?Patient ID:  Dragon Thrush is a 56 y.o. (DOB Nov 23, 1965) male. ? ?Chief Complaint:  ?Chief Complaint  ?Patient presents with  ? Follow-up  ?  Anxiety, depression, and insomnia  ? ? ?HPI ?Jonathan Key presents to the office today for follow-up of anxiety, depression, and insomnia. He reports that Lexapro does not seem to be as effective as Sertraline. He reports that diarrhea resolved almost immediately after stopping Sertraline. He denies anxiety. He reports that he has been socially withdrawn again at work. He reports that he has been annoyed by physical illness. He notices some irritability and feeling aggravated. Sleeping well. Appetite has been ok other than decreased with cold. Energy has been ok. Motivation has been ok. Concentration has been good. Denies SI.  ? ?Had COVID in early February and then had a secondary infection. ? ?He reports that he has some work stress.  ? ?Past Psychiatric Medication Trials: ?Temazepam- Helpful for insomnia ?Xanax- Helpful for insomnia ?Trazodone- Felt like he was in a fog ?Hydroxyzine- grogginess ?Belsomra ?Melatonin ?Zzzquil ?Paxil- unable to sleep ?Sertraline ?Wellbutrin XL ?May have tried Lexapro ? ?PHQ2-9   ? ?[place] Office Visit from 08/04/2019 in [facility], [facility] Office Visit from 10/03/2015 in [facility]  ?PHQ-2 Total Score 0 0  ? ?  ? ?[place] ED from 11/28/2021 in [facility] at [facility]  ED from 11/15/2021 in [facility] at [facility]  ED from 04/20/2021 in [facility] at [facility]   ?C-SSRS RISK CATEGORY No Risk No Risk No Risk  ? ?  ?  ? ?Review of Systems:  ?Review of Systems  ?HENT:  Positive for sinus pressure and sinus pain.   ?Musculoskeletal:  Negative for gait problem.  ?Neurological:  Negative for tremors.  ?Psychiatric/Behavioral:    ?     Please refer to HPI  ? ?Medications: I have reviewed the patient's current  medications. ? ?Current Outpatient Medications  ?Medication Sig Dispense Refill  ? cefdinir (OMNICEF) 300 MG capsule Take 1 capsule (300 mg total) by mouth 2 (two) times daily. 20 capsule 0  ? loratadine (CLARITIN) 10 MG tablet Take 10 mg by mouth daily.    ? Multiple Vitamin (MULTIVITAMIN) tablet Take 1 tablet by mouth daily.    ? predniSONE (DELTASONE) 20 MG tablet Take 2 tablets (40 mg total) by mouth daily for 5 days. 10 tablet 0  ? Pseudoeph-Doxylamine-DM-APAP (NYQUIL PO) Take by mouth at bedtime as needed.    ? buPROPion (WELLBUTRIN XL) 150 MG 24 hr tablet Take 1 tablet (150 mg total) by mouth daily. 90 tablet 0  ? escitalopram (LEXAPRO) 10 MG tablet Take 1 tablet (10 mg total) by mouth daily. 30 tablet 1  ? Fluocinonide 0.1 % CREA Apply topically.    ? promethazine (PHENERGAN) 6.25 MG/5ML syrup Take 5 mLs (6.25 mg total) by mouth at bedtime as needed (cough). (Patient not taking: Reported on 11/29/2021) 60 mL 0  ? temazepam (RESTORIL) 15 MG capsule Take 1 capsule (15 mg total) by mouth at bedtime as needed for sleep. 30 capsule 2  ? ?No current facility-administered medications for this visit.  ? ? ?Medication Side Effects: None ? ?Allergies:  ?Allergies  ?Allergen Reactions  ? Morphine And Related Rash  ? ? ?Past Medical History:  ?Diagnosis Date  ? Atopic dermatitis   ? COVID-19 09/2020  ? Hyperlipidemia   ? Hypertension   ?  Plantar fasciitis   ? ? ?Past Medical History, Surgical history, Social history, and Family history were reviewed and updated as appropriate.  ? ?Please see review of systems for further details on the patient's review from today.  ? ?Objective:  ? ?Physical Exam:  ?There were no vitals taken for this visit. ? ?Physical Exam ?Constitutional:   ?   General: He is not in acute distress. ?Musculoskeletal:     ?   General: No deformity.  ?Neurological:  ?   Mental Status: He is alert and oriented to person, place, and time.  ?   Coordination: Coordination normal.  ?Psychiatric:     ?    Attention and Perception: Attention and perception normal. He does not perceive auditory or visual hallucinations.     ?   Mood and Affect: Mood normal. Mood is not anxious or depressed. Affect is not labile, blunt, angry or inappropriate.     ?   Speech: Speech normal.     ?   Behavior: Behavior normal.     ?   Thought Content: Thought content normal. Thought content is not paranoid or delusional. Thought content does not include homicidal or suicidal ideation. Thought content does not include homicidal or suicidal plan.     ?   Cognition and Memory: Cognition and memory normal.     ?   Judgment: Judgment normal.  ?   Comments: Insight intact  ? ? ?Lab Review:  ?   ?Component Value Date/Time  ? CREATININE 1.1 03/28/2014 0000  ? ? ?No results found for: WBC, RBC, HGB, HCT, PLT, MCV, MCH, MCHC, RDW, LYMPHSABS, MONOABS, EOSABS, BASOSABS ? ?No results found for: POCLITH, LITHIUM  ? ?No results found for: PHENYTOIN, PHENOBARB, VALPROATE, CBMZ  ? ?.res ?Assessment: Plan:   ?Pt seen for 30 minutes and time spent discussing potential benefits, risks, and side effects of increasing Lexapro to 10 mg po qd since he has seen a partial improvement in mood and anxiety s/s with Lexapro 5 mg po qd without tolerability issues. He agrees to increase in Lexapro to 10 mg po qd. ?Discussed waiting to start increased dose in about a week since he just started Prednisone 5 day taper and this could cause changes in mood, anxiety, and insomnia; which could make it difficult to determine response to increased dose.  ?Continue Wellbutrin XL 150 mg po qd for depression.  ?Pt to follow-up in 6-8 weeks or sooner if clinically indicated.  ?Patient advised to contact office with any questions, adverse effects, or acute worsening in signs and symptoms. ? ? ?Faye was seen today for follow-up. ? ?Diagnoses and all orders for this visit: ? ?Seasonal affective disorder ([place]) ?-     escitalopram (LEXAPRO) 10 MG tablet; Take 1 tablet (10 mg total) by  mouth daily. ?-     buPROPion (WELLBUTRIN XL) 150 MG 24 hr tablet; Take 1 tablet (150 mg total) by mouth daily. ? ?Generalized anxiety disorder ?-     escitalopram (LEXAPRO) 10 MG tablet; Take 1 tablet (10 mg total) by mouth daily. ? ?  ? ?Please see After Visit Summary for patient specific instructions. ? ?Future Appointments  ?Date Time Provider [place]  ?01/18/2022 12:45 PM Thayer Headings, PMHNP [facility] None  ?05/27/2022  9:30 AM Ralene Bathe, MD [facility] None  ? ? ?No orders of the defined types were placed in this encounter. ? ? ?------------------------------- ?

## 2021-12-25 ENCOUNTER — Other Ambulatory Visit: Payer: Self-pay

## 2021-12-25 DIAGNOSIS — F338 Other recurrent depressive disorders: Secondary | ICD-10-CM

## 2021-12-25 DIAGNOSIS — F411 Generalized anxiety disorder: Secondary | ICD-10-CM

## 2021-12-25 MED ORDER — ESCITALOPRAM OXALATE 10 MG PO TABS: 10.0000 mg | ORAL_TABLET | Freq: Every day | ORAL | 0 refills | Status: DC

## 2022-01-15 ENCOUNTER — Other Ambulatory Visit: Payer: Self-pay

## 2022-01-18 ENCOUNTER — Encounter: Payer: Self-pay | Admitting: Psychiatry

## 2022-01-18 ENCOUNTER — Ambulatory Visit: Payer: 59 | Admitting: Psychiatry

## 2022-01-18 DIAGNOSIS — F338 Other recurrent depressive disorders: Secondary | ICD-10-CM

## 2022-01-18 DIAGNOSIS — F411 Generalized anxiety disorder: Secondary | ICD-10-CM | POA: Diagnosis not present

## 2022-01-18 DIAGNOSIS — G479 Sleep disorder, unspecified: Secondary | ICD-10-CM | POA: Diagnosis not present

## 2022-01-18 MED ORDER — ESCITALOPRAM OXALATE 10 MG PO TABS: 10.0000 mg | ORAL_TABLET | Freq: Every day | ORAL | 1 refills | Status: DC

## 2022-01-18 MED ORDER — TEMAZEPAM 15 MG PO CAPS: 15.0000 mg | ORAL_CAPSULE | Freq: Every evening | ORAL | 1 refills | Status: DC | PRN

## 2022-01-18 MED ORDER — BUPROPION HCL ER (XL) 150 MG PO TB24: 150.0000 mg | ORAL_TABLET | Freq: Every day | ORAL | 1 refills | Status: DC

## 2022-01-18 NOTE — Progress Notes (Signed)
Jonathan Key ?[id number] ?December 04, 1965 ?56 y.o. ? ?Subjective:  ? ?Patient ID:  Jonathan Key is a 56 y.o. (DOB [birth year]) male. ? ?Chief Complaint:  ?Chief Complaint  ?Patient presents with  ? Follow-up  ?  Anxiety and depression  ? ? ?HPI ?Jonathan Key presents to the office today for follow-up of anxiety and depression. "Everything seems to be fine." He feels that Lexapro 10 mg has been more effective than Sertraline. He reports that his mood has been good. Denies depressed mood or irritability. He denies any side effects of increased dose of Lexapro. Denies anxiety. Energy and motivation has been ok. Appetite has been good. He has been sleeping ok other than when he is awakened with some back pain that started during [place]. Concentration is adequate. Denies SI.  ? ?He reports that work seems to "be going a lot better." He reports "I don't dread going to work everyday." He reports that he has been very busy. He has been enjoying being outside, riding bikes, motorcycles, and hiking.  ? ?Temazepam last filled 01/09/22. ? ?Past Psychiatric Medication Trials: ?Temazepam- Helpful for insomnia ?Xanax- Helpful for insomnia ?Trazodone- Felt like he was in a fog ?Hydroxyzine- grogginess ?Belsomra ?Melatonin ?Zzzquil ?Paxil- unable to sleep ?Sertraline ?Wellbutrin XL ?May have tried Lexapro ? ?PHQ2-9   ? ?[facility] Visit from 08/04/2019 in [facility] Office Visit from 10/03/2015 in [facility]  ?PHQ-2 Total Score 0 0  ? ?  ? ?[place] [facility] from 11/28/2021 in [facility] at [facility]  ED from 11/15/2021 in [facility] at [facility]  ED from 04/20/2021 in [facility] at [facility]   ?C-SSRS RISK CATEGORY No Risk No Risk No Risk  ? ?  ?  ? ?Review of Systems:  ?Review of Systems  ?Gastrointestinal:  Negative for diarrhea.  ?Musculoskeletal:  Positive for back pain. Negative for gait problem.  ?Allergic/Immunologic: Positive for environmental  allergies.  ?Psychiatric/Behavioral:    ?     Please refer to HPI  ? ?Medications: I have reviewed the patient's current medications. ? ?Current Outpatient Medications  ?Medication Sig Dispense Refill  ? loratadine (CLARITIN) 10 MG tablet Take 10 mg by mouth daily.    ? Multiple Vitamin (MULTIVITAMIN) tablet Take 1 tablet by mouth daily.    ? buPROPion (WELLBUTRIN XL) 150 MG 24 hr tablet Take 1 tablet (150 mg total) by mouth daily. 90 tablet 1  ? cefdinir (OMNICEF) 300 MG capsule Take 1 capsule (300 mg total) by mouth 2 (two) times daily. (Patient not taking: Reported on 01/18/2022) 20 capsule 0  ? escitalopram (LEXAPRO) 10 MG tablet Take 1 tablet (10 mg total) by mouth daily. 90 tablet 1  ? Fluocinonide 0.1 % CREA Apply topically.    ? promethazine (PHENERGAN) 6.25 MG/5ML syrup Take 5 mLs (6.25 mg total) by mouth at bedtime as needed (cough). (Patient not taking: Reported on 11/29/2021) 60 mL 0  ? [START ON 02/06/2022] temazepam (RESTORIL) 15 MG capsule Take 1 capsule (15 mg total) by mouth at bedtime as needed for sleep. 90 capsule 1  ? ?No current facility-administered medications for this visit.  ? ? ?Medication Side Effects: None ? ?Allergies:  ?Allergies  ?Allergen Reactions  ? Morphine And Related Rash  ? ? ?Past Medical History:  ?Diagnosis Date  ? Atopic dermatitis   ? COVID-19 09/2020  ? Hyperlipidemia   ? Hypertension   ? Plantar fasciitis   ? ? ?Past Medical History,  Surgical history, Social history, and Family history were reviewed and updated as appropriate.  ? ?Please see review of systems for further details on the patient's review from today.  ? ?Objective:  ? ?Physical Exam:  ?There were no vitals taken for this visit. ? ?Physical Exam ?Constitutional:   ?   General: He is not in acute distress. ?Musculoskeletal:     ?   General: No deformity.  ?Neurological:  ?   Mental Status: He is alert and oriented to person, place, and time.  ?   Coordination: Coordination normal.  ?Psychiatric:     ?   Attention  and Perception: Attention and perception normal. He does not perceive auditory or visual hallucinations.     ?   Mood and Affect: Mood normal. Mood is not anxious or depressed. Affect is not labile, blunt, angry or inappropriate.     ?   Speech: Speech normal.     ?   Behavior: Behavior normal.     ?   Thought Content: Thought content normal. Thought content is not paranoid or delusional. Thought content does not include homicidal or suicidal ideation. Thought content does not include homicidal or suicidal plan.     ?   Cognition and Memory: Cognition and memory normal.     ?   Judgment: Judgment normal.  ?   Comments: Insight intact  ? ? ?Lab Review:  ?   ?Component Value Date/Time  ? CREATININE 1.1 03/28/2014 0000  ? ? ?No results found for: WBC, RBC, HGB, HCT, PLT, MCV, MCH, MCHC, RDW, LYMPHSABS, MONOABS, EOSABS, BASOSABS ? ?No results found for: POCLITH, LITHIUM  ? ?No results found for: PHENYTOIN, PHENOBARB, VALPROATE, CBMZ  ? ?.res ?Assessment: Plan:   ?Will continue current plan of care since target signs and symptoms are well controlled without any tolerability issues. ?Continue Lexapro 10 mg po qd for depression and anxiety. ?Continue Wellbutrin XL 150 mg daily for depression.  ?Continue Temazepam 15 mg po QHS insomnia.  ?Pt to follow-up in 6 months or sooner if clinically indicated.  ?Patient advised to contact office with any questions, adverse effects, or acute worsening in signs and symptoms. ? ?Jonathan Key was seen today for follow-up. ? ?Diagnoses and all orders for this visit: ? ?Seasonal affective disorder (HCC) ?-     buPROPion (WELLBUTRIN XL) 150 MG 24 hr tablet; Take 1 tablet (150 mg total) by mouth daily. ?-     escitalopram (LEXAPRO) 10 MG tablet; Take 1 tablet (10 mg total) by mouth daily. ? ?Generalized anxiety disorder ?-     escitalopram (LEXAPRO) 10 MG tablet; Take 1 tablet (10 mg total) by mouth daily. ? ?Sleep disturbance ?-     temazepam (RESTORIL) 15 MG capsule; Take 1 capsule (15 mg total)  by mouth at bedtime as needed for sleep. ? ?  ? ?Please see After Visit Summary for patient specific instructions. ? ?Future Appointments  ?Date Time Provider [place]  ?05/27/2022  9:30 AM Ralene Bathe, MD [facility] None  ?07/19/2022 11:00 AM Thayer Headings, PMHNP [facility] None  ? ? ?No orders of the defined types were placed in this encounter. ? ? ?------------------------------- ?

## 2022-03-01 ENCOUNTER — Encounter: Payer: Self-pay | Admitting: Nurse Practitioner

## 2022-03-01 ENCOUNTER — Ambulatory Visit (INDEPENDENT_AMBULATORY_CARE_PROVIDER_SITE_OTHER)
Admission: RE | Admit: 2022-03-01 | Discharge: 2022-03-01 | Disposition: A | Discharge status: Home / Self Care | Payer: Managed Care, Other (non HMO) | Source: Ambulatory Visit | Attending: Nurse Practitioner | Admitting: Nurse Practitioner

## 2022-03-01 ENCOUNTER — Ambulatory Visit: Payer: Managed Care, Other (non HMO) | Admitting: Nurse Practitioner

## 2022-03-01 VITALS — BP 130/82 | HR 93 | Temp 97.5°F | Resp 10 | Ht 70.0 in | Wt 218.2 lb

## 2022-03-01 DIAGNOSIS — G8929 Other chronic pain: Secondary | ICD-10-CM | POA: Insufficient documentation

## 2022-03-01 DIAGNOSIS — Z125 Encounter for screening for malignant neoplasm of prostate: Secondary | ICD-10-CM

## 2022-03-01 DIAGNOSIS — M545 Low back pain, unspecified: Secondary | ICD-10-CM | POA: Insufficient documentation

## 2022-03-01 DIAGNOSIS — E6609 Other obesity due to excess calories: Secondary | ICD-10-CM | POA: Insufficient documentation

## 2022-03-01 DIAGNOSIS — Z0000 Encounter for general adult medical examination without abnormal findings: Secondary | ICD-10-CM | POA: Diagnosis not present

## 2022-03-01 DIAGNOSIS — E66811 Obesity, class 1: Secondary | ICD-10-CM | POA: Insufficient documentation

## 2022-03-01 DIAGNOSIS — Z6831 Body mass index (BMI) 31.0-31.9, adult: Secondary | ICD-10-CM

## 2022-03-01 NOTE — Patient Instructions (Signed)
Nice to see you today I will be in touch with the labs and xray results Follow up with me in 1 year for your next physical

## 2022-03-01 NOTE — Assessment & Plan Note (Signed)
Discussed age-appropriate immunizations and screening exams.  Patient would like to think about the Shingrix vaccine information given today in office

## 2022-03-01 NOTE — Progress Notes (Signed)
New Patient Office Visit  Subjective    Patient ID: Jonathan Key, male    DOB: [birth year]  Age: 56 y.o. MRN: [id number]  CC:  Chief Complaint  Patient presents with   Establish Care    Previous PCP with [facility] years ago, seen behavioral, also [facility]   Back Pain    Ever since having Covid in February and the cough was so severe that he messed up something with his lumbar back area. Not all the time and certain positions will aggravate the area.    HPI Jonathan Key presents to establish care  Insomnia and Anxiety: on welbutrin and restoril. Sees Thayer Head[facility].  Does See Dermatology Dr. Sarina Ser  Back: States it has been going on since Feb 2023. States he was having coughing fits with covid and felt a zing and has been there since. States that he can  feel it with certain movement. Has tried  tylenol and ibuprofen that is helpful  for complete physical and follow up of chronic conditions.  Immunizations: -Tetanus: 2016 -Influenza: out of season  -Covid-19:refused  -Shingles: information given in office -Pneumonia: too young  -HPV: aged out  Diet: [place].  3 meals a day with little snacks. Healthy snack. Coffe in the am and mostly water Exercise: recumbent bike 15 mins daily and will do light resistance 20-60mns daily  Eye exam: Completes annually. [facility]   Dental exam: Completes semi-annually   Colonoscopy: Completed in 06/2021 and then 2019, recall in 10 years. Due 2032 Lung Cancer Screening: NA  Dexa: NA  PSA: Due, ordered today  Sleep: 9pm bedtime and will get up at 445am. Feels rested. No snore       Outpatient Encounter Medications as of 03/01/2022  Medication Sig   buPROPion (WELLBUTRIN XL) 150 MG 24 hr tablet Take 1 tablet (150 mg total) by mouth daily.   escitalopram (LEXAPRO) 10 MG tablet Take 1 tablet (10 mg total) by mouth daily.   Fluocinonide 0.1 % CREA Apply topically.   loratadine (CLARITIN) 10 MG  tablet Take 10 mg by mouth daily.   Multiple Vitamin (MULTIVITAMIN) tablet Take 1 tablet by mouth daily.   temazepam (RESTORIL) 15 MG capsule Take 1 capsule (15 mg total) by mouth at bedtime as needed for sleep.   [DISCONTINUED] cefdinir (OMNICEF) 300 MG capsule Take 1 capsule (300 mg total) by mouth 2 (two) times daily. (Patient not taking: Reported on 01/18/2022)   [DISCONTINUED] promethazine (PHENERGAN) 6.25 MG/5ML syrup Take 5 mLs (6.25 mg total) by mouth at bedtime as needed (cough). (Patient not taking: Reported on 11/29/2021)   No facility-administered encounter medications on file as of 03/01/2022.    Past Medical History:  Diagnosis Date   Atopic dermatitis    COVID-19 09/2020   Hyperlipidemia    Hypertension    Plantar fasciitis     Past Surgical History:  Procedure Laterality Date   FRACTURE SURGERY  2002 and 2003   right arm fracture with 3 surgical repairs   WRIST FRACTURE SURGERY Right 1991   crush injury [place]ti[place]    Family History  Problem Relation Age of Onset   Stroke Father    Obesity Brother    Obesity Brother    Obesity Brother    Cancer Maternal Aunt 654      Breast Cancer    Diabetes Maternal Grandmother    Cancer Maternal Grandmother    Dementia Maternal Grandmother  Cancer Maternal Grandfather 5       Stomach Cancer   Parkinson's disease Paternal Grandfather     Social History   Socioeconomic History   Marital status: Married    Spouse name: Wiley Magan   Number of children: Not on file   Years of education: 15   Highest education level: Associate degree: occupational, Hotel manager, or vocational program  Occupational History   Not on file  Tobacco Use   Smoking status: Never   Smokeless tobacco: Never  Vaping Use   Vaping Use: Never used  Substance and Sexual Activity   Alcohol use: Yes    Comment: Drinks beer on weekends 6 pack   Drug use: No   Sexual activity: Not on file  Other Topics Concern   Not on  file  Social History Narrative   Waleed grew up in [place]. He attended Hotel manager school at [employer] where he obtained a certificate in Facilities manager and Set designer in [place]. He worked in [employer] and moved quite often with them up and down the [employer]. 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 He lives at home with his wife, [name], of 15 years. They do not have any children. They have a cat. Ransom enjoys yard work, traveling and riding motorcycle.    Social Determinants of Health   Financial Resource Strain: Not on file  Food Insecurity: Not on file  Transportation Needs: Not on file  Physical Activity: Not on file  Stress: Not on file  Social Connections: Not on file  Intimate Partner Violence: Not on file    Review of Systems  Constitutional:  Negative for chills, fever and malaise/fatigue.  Respiratory:  Negative for cough and shortness of breath.   Cardiovascular:  Negative for chest pain and leg swelling.  Gastrointestinal:  Negative for abdominal pain, diarrhea, nausea and vomiting.       BM daily  Genitourinary:  Negative for dysuria and hematuria.       Nocturia intermittent  Neurological:  Negative for dizziness, tingling, weakness and headaches.  Psychiatric/Behavioral:  Negative for hallucinations and suicidal ideas.        Objective    BP 130/82   Pulse 93   Temp (!) 97.5 F (36.4 C)   Resp 10   Ht '5\' 10"'$  (1.778 m)   Wt 218 lb 4 oz (99 kg)   SpO2 96%   BMI 31.32 kg/m   Physical Exam Vitals and nursing note reviewed. Exam conducted with a chaperone present [facility], RMA).  Constitutional:      Appearance: Normal appearance.  HENT:     Right Ear: Tympanic membrane, ear canal and external ear normal.     Left Ear: Tympanic membrane, ear canal and external ear normal.     Mouth/Throat:     Mouth: Mucous membranes are moist.     Pharynx: Oropharynx is clear.  Eyes:      Extraocular Movements: Extraocular movements intact.     Pupils: Pupils are equal, round, and reactive to light.     Comments: Wears glasses  Cardiovascular:     Rate and Rhythm: Normal rate and regular rhythm.     Heart sounds: Normal heart sounds.  Pulmonary:     Effort: Pulmonary effort is normal.     Breath sounds: Normal breath sounds.  Abdominal:     General: Bowel sounds are normal. There is no distension.  Palpations: There is no mass.     Tenderness: There is no abdominal tenderness.     Hernia: No hernia is present. There is no hernia in the left inguinal area or right inguinal area.  Genitourinary:    Penis: Normal.      Testes: Normal.     Epididymis:     Right: Normal.     Left: Normal.  Musculoskeletal:     Right lower leg: No edema.     Left lower leg: No edema.  Lymphadenopathy:     Cervical: No cervical adenopathy.     Lower Body: No right inguinal adenopathy. No left inguinal adenopathy.  Skin:    General: Skin is warm.  Neurological:     General: No focal deficit present.     Mental Status: He is alert.     Comments: Bilateral upper and lower extremity strength 5/5        Assessment & Plan:   Problem List Items Addressed This Visit       Other   Preventative health care - Primary    Discussed age-appropriate immunizations and screening exams.  Patient would like to think about the Shingrix vaccine information given today in office       Relevant Orders   CBC   Comprehensive metabolic panel   Hemoglobin A1c   Lipid panel   Chronic bilateral low back pain without sciatica    Pending x-ray in office.  Patient benefit from muscle relaxer if x-ray looks okay.  No red flags during exam.       Relevant Orders   DG Lumbar Spine Complete   Class 1 obesity due to excess calories without serious comorbidity with body mass index (BMI) of 31.0 to 31.9 in adult    Patient currently doing adequate amount of exercise daily.  Continue healthy  lifestyle modifications.       Relevant Orders   Hemoglobin A1c   Lipid panel   Other Visit Diagnoses     Screening for prostate cancer       Relevant Orders   PSA       Return in about 1 year (around 03/02/2023) for cpe and labs.   Romilda Garret, NP

## 2022-03-01 NOTE — Assessment & Plan Note (Signed)
Pending x-ray in office.  Patient benefit from muscle relaxer if x-ray looks okay.  No red flags during exam.

## 2022-03-01 NOTE — Assessment & Plan Note (Signed)
Patient currently doing adequate amount of exercise daily.  Continue healthy lifestyle modifications.

## 2022-03-02 LAB — CBC
Hematocrit: 46.9 % (ref 37.5–51.0)
Hemoglobin: 16.1 g/dL (ref 13.0–17.7)
MCH: 32.3 pg (ref 26.6–33.0)
MCHC: 34.3 g/dL (ref 31.5–35.7)
MCV: 94 fL (ref 79–97)
Platelets: 249 10*3/uL (ref 150–450)
RBC: 4.99 x10E6/uL (ref 4.14–5.80)
RDW: 12.3 % (ref 11.6–15.4)
WBC: 4.6 10*3/uL (ref 3.4–10.8)

## 2022-03-02 LAB — COMPREHENSIVE METABOLIC PANEL
ALT: 40 IU/L (ref 0–44)
AST: 30 IU/L (ref 0–40)
Albumin/Globulin Ratio: 2.2 (ref 1.2–2.2)
Albumin: 4.8 g/dL (ref 3.8–4.9)
Alkaline Phosphatase: 76 IU/L (ref 44–121)
BUN/Creatinine Ratio: 12 (ref 9–20)
BUN: 14 mg/dL (ref 6–24)
Bilirubin Total: 0.4 mg/dL (ref 0.0–1.2)
CO2: 24 mmol/L (ref 20–29)
Calcium: 9.5 mg/dL (ref 8.7–10.2)
Chloride: 100 mmol/L (ref 96–106)
Creatinine, Ser: 1.13 mg/dL (ref 0.76–1.27)
Globulin, Total: 2.2 g/dL (ref 1.5–4.5)
Glucose: 100 mg/dL — ABNORMAL HIGH (ref 70–99)
Potassium: 4.4 mmol/L (ref 3.5–5.2)
Sodium: 138 mmol/L (ref 134–144)
Total Protein: 7 g/dL (ref 6.0–8.5)
eGFR: 76 mL/min/{1.73_m2} (ref >59)

## 2022-03-02 LAB — LIPID PANEL
Chol/HDL Ratio: 4.1 ratio (ref 0.0–5.0)
Cholesterol, Total: 295 mg/dL — ABNORMAL HIGH (ref 100–199)
HDL: 72 mg/dL (ref >39)
LDL Chol Calc (NIH): 183 mg/dL — ABNORMAL HIGH (ref 0–99)
Triglycerides: 217 mg/dL — ABNORMAL HIGH (ref 0–149)
VLDL Cholesterol Cal: 40 mg/dL (ref 5–40)

## 2022-03-02 LAB — HEMOGLOBIN A1C
Est. average glucose Bld gHb Est-mCnc: 108 mg/dL
Hgb A1c MFr Bld: 5.4 % (ref 4.8–5.6)

## 2022-03-02 LAB — PSA: Prostate Specific Ag, Serum: 0.9 ng/mL (ref 0.0–4.0)

## 2022-03-04 ENCOUNTER — Encounter: Payer: Self-pay | Admitting: Nurse Practitioner

## 2022-03-04 DIAGNOSIS — G8929 Other chronic pain: Secondary | ICD-10-CM

## 2022-03-05 MED ORDER — CYCLOBENZAPRINE HCL 5 MG PO TABS: 5.0000 mg | ORAL_TABLET | Freq: Two times a day (BID) | ORAL | 0 refills | Status: AC | PRN

## 2022-05-27 ENCOUNTER — Encounter: Payer: Self-pay | Admitting: Dermatology

## 2022-05-27 ENCOUNTER — Ambulatory Visit: Payer: Managed Care, Other (non HMO) | Admitting: Dermatology

## 2022-05-27 DIAGNOSIS — L814 Other melanin hyperpigmentation: Secondary | ICD-10-CM

## 2022-05-27 DIAGNOSIS — L821 Other seborrheic keratosis: Secondary | ICD-10-CM

## 2022-05-27 DIAGNOSIS — L818 Other specified disorders of pigmentation: Secondary | ICD-10-CM

## 2022-05-27 DIAGNOSIS — Z1283 Encounter for screening for malignant neoplasm of skin: Secondary | ICD-10-CM | POA: Diagnosis not present

## 2022-05-27 DIAGNOSIS — L570 Actinic keratosis: Secondary | ICD-10-CM

## 2022-05-27 DIAGNOSIS — L578 Other skin changes due to chronic exposure to nonionizing radiation: Secondary | ICD-10-CM | POA: Diagnosis not present

## 2022-05-27 DIAGNOSIS — D1800 Hemangioma unspecified site: Secondary | ICD-10-CM

## 2022-05-27 DIAGNOSIS — D229 Melanocytic nevi, unspecified: Secondary | ICD-10-CM

## 2022-05-27 NOTE — Progress Notes (Signed)
   Follow-Up Visit   Subjective  Jonathan Key is a 56 y.o. male who presents for the following: Annual Exam (History of AK - The patient presents for Upper Body Skin Exam (UBSE) for skin cancer screening and mole check.  The patient has spots, moles and lesions to be evaluated, some may be new or changing and the patient has concerns that these could be cancer./).  The following portions of the chart were reviewed this encounter and updated as appropriate:   Tobacco  Allergies  Meds  Problems  Med Hx  Surg Hx  Fam Hx     Review of Systems:  No other skin or systemic complaints except as noted in HPI or Assessment and Plan.  Objective  Well appearing patient in no apparent distress; mood and affect are within normal limits.  All skin waist up examined.  Arms, chest Hypopigmented macules  Scalp x 1 Erythematous thin papules/macules with gritty scale.    Assessment & Plan   Lentigines - Scattered tan macules - Due to sun exposure - Benign-appearing, observe - Recommend daily broad spectrum sunscreen SPF 30+ to sun-exposed areas, reapply every 2 hours as needed. - Call for any changes  Seborrheic Keratoses - Stuck-on, waxy, tan-brown papules and/or plaques  - Benign-appearing - Discussed benign etiology and prognosis. - Observe - Call for any changes  Melanocytic Nevi - Tan-brown and/or pink-flesh-colored symmetric macules and papules - Benign appearing on exam today - Observation - Call clinic for new or changing moles - Recommend daily use of broad spectrum spf 30+ sunscreen to sun-exposed areas.   Hemangiomas - Red papules - Discussed benign nature - Observe - Call for any changes  Actinic Damage - Chronic condition, secondary to cumulative UV/sun exposure - diffuse scaly erythematous macules with underlying dyspigmentation - Recommend daily broad spectrum sunscreen SPF 30+ to sun-exposed areas, reapply every 2 hours as needed.  - Staying in the shade or  wearing long sleeves, sun glasses (UVA+UVB protection) and wide brim hats (4-inch brim around the entire circumference of the hat) are also recommended for sun protection.  - Call for new or changing lesions.  Skin cancer screening performed today.  Idiopathic guttate hypomelanosis Arms, chest Benign, observe.   AK (actinic keratosis) Scalp x 1 Destruction of lesion - Scalp x 1 Complexity: simple   Destruction method: cryotherapy   Informed consent: discussed and consent obtained   Timeout:  patient name, date of birth, surgical site, and procedure verified Lesion destroyed using liquid nitrogen: Yes   Region frozen until ice ball extended beyond lesion: Yes   Outcome: patient tolerated procedure well with no complications   Post-procedure details: wound care instructions given    Return in about 1 year (around 05/28/2023) for UBSE.  I, Ashok Cordia, CMA, am acting as scribe for Sarina Ser, MD . Documentation: I have reviewed the above documentation for accuracy and completeness, and I agree with the above.  Sarina Ser, MD

## 2022-05-27 NOTE — Patient Instructions (Signed)
Cryotherapy Aftercare  Wash gently with soap and water everyday.   Apply Vaseline and Band-Aid daily until healed.     Due to recent changes in healthcare laws, you may see results of your pathology and/or laboratory studies on MyChart before the doctors have had a chance to review them. We understand that in some cases there may be results that are confusing or concerning to you. Please understand that not all results are received at the same time and often the doctors may need to interpret multiple results in order to provide you with the best plan of care or course of treatment. Therefore, we ask that you please give us 2 business days to thoroughly review all your results before contacting the office for clarification. Should we see a critical lab result, you will be contacted sooner.   If You Need Anything After Your Visit  If you have any questions or concerns for your doctor, please call our main line at [phone] and press option 4 to reach your doctor's medical assistant. If no one answers, please leave a voicemail as directed and we will return your call as soon as possible. Messages left after 4 pm will be answered the following business day.   You may also send us a message via MyChart. We typically respond to MyChart messages within 1-2 business days.  For prescription refills, please ask your pharmacy to contact our office. Our fax number is [phone].  If you have an urgent issue when the clinic is closed that cannot wait until the next business day, you can page your doctor at the number below.    Please note that while we do our best to be available for urgent issues outside of office hours, we are not available 24/7.   If you have an urgent issue and are unable to reach us, you may choose to seek medical care at your doctor's office, retail clinic, urgent care center, or emergency room.  If you have a medical emergency, please immediately call 911 or go to the  emergency department.  Pager Numbers  - Dr. Kowalski: [phone]  - Dr. Moye: [phone]  - Dr. Stewart: [phone]  In the event of inclement weather, please call our main line at [phone] for an update on the status of any delays or closures.  Dermatology Medication Tips: Please keep the boxes that topical medications come in in order to help keep track of the instructions about where and how to use these. Pharmacies typically print the medication instructions only on the boxes and not directly on the medication tubes.   If your medication is too expensive, please contact our office at [phone] option 4 or send us a message through MyChart.   We are unable to tell what your co-pay for medications will be in advance as this is different depending on your insurance coverage. However, we may be able to find a substitute medication at lower cost or fill out paperwork to get insurance to cover a needed medication.   If a prior authorization is required to get your medication covered by your insurance company, please allow us 1-2 business days to complete this process.  Drug prices often vary depending on where the prescription is filled and some pharmacies may offer cheaper prices.  The website www.goodrx.com contains coupons for medications through different pharmacies. The prices here do not account for what the cost may be with help from insurance (it may be cheaper with your insurance), but the website can   give you the price if you did not use any insurance.  - You can print the associated coupon and take it with your prescription to the pharmacy.  - You may also stop by our office during regular business hours and pick up a GoodRx coupon card.  - If you need your prescription sent electronically to a different pharmacy, notify our office through [facility] MyChart or by phone at [phone] option 4.     Si Usted Necesita Algo Despus de Su Visita  Tambin puede  enviarnos un mensaje a travs de MyChart. Por lo general respondemos a los mensajes de MyChart en el transcurso de 1 a 2 das hbiles.  Para renovar recetas, por favor pida a su farmacia que se ponga en contacto con nuestra oficina. Nuestro nmero de fax es el [phone].  Si tiene un asunto urgente cuando la clnica est cerrada y que no puede esperar hasta el siguiente da hbil, puede llamar/localizar a su doctor(a) al nmero que aparece a continuacin.   Por favor, tenga en cuenta que aunque hacemos todo lo posible para estar disponibles para asuntos urgentes fuera del horario de oficina, no estamos disponibles las 24 horas del da, los 7 das de la semana.   Si tiene un problema urgente y no puede comunicarse con nosotros, puede optar por buscar atencin mdica  en el consultorio de su doctor(a), en una clnica privada, en un centro de atencin urgente o en una sala de emergencias.  Si tiene una emergencia mdica, por favor llame inmediatamente al 911 o vaya a la sala de emergencias.  Nmeros de bper  - Dr. Kowalski: [phone]  - Dra. Moye: [phone]  - Dra. Stewart: [phone]  En caso de inclemencias del tiempo, por favor llame a nuestra lnea principal al [phone] para una actualizacin sobre el estado de cualquier retraso o cierre.  Consejos para la medicacin en dermatologa: Por favor, guarde las cajas en las que vienen los medicamentos de uso tpico para ayudarle a seguir las instrucciones sobre dnde y cmo usarlos. Las farmacias generalmente imprimen las instrucciones del medicamento slo en las cajas y no directamente en los tubos del medicamento.   Si su medicamento es muy caro, por favor, pngase en contacto con nuestra oficina llamando al [phone] y presione la opcin 4 o envenos un mensaje a travs de MyChart.   No podemos decirle cul ser su copago por los medicamentos por adelantado ya que esto es diferente dependiendo de la cobertura de su seguro.  Sin embargo, es posible que podamos encontrar un medicamento sustituto a menor costo o llenar un formulario para que el seguro cubra el medicamento que se considera necesario.   Si se requiere una autorizacin previa para que su compaa de seguros cubra su medicamento, por favor permtanos de 1 a 2 das hbiles para completar este proceso.  Los precios de los medicamentos varan con frecuencia dependiendo del lugar de dnde se surte la receta y alguna farmacias pueden ofrecer precios ms baratos.  El sitio web www.goodrx.com tiene cupones para medicamentos de diferentes farmacias. Los precios aqu no tienen en cuenta lo que podra costar con la ayuda del seguro (puede ser ms barato con su seguro), pero el sitio web puede darle el precio si no utiliz ningn seguro.  - Puede imprimir el cupn correspondiente y llevarlo con su receta a la farmacia.  - Tambin puede pasar por nuestra oficina durante el horario de atencin regular y recoger una tarjeta de cupones de GoodRx.  -   Si necesita que su receta se enve electrnicamente a una farmacia diferente, informe a nuestra oficina a travs de MyChart de [place] o por telfono llamando al [phone] y presione la opcin 4.  

## 2022-07-10 ENCOUNTER — Other Ambulatory Visit: Payer: Self-pay | Admitting: Psychiatry

## 2022-07-10 DIAGNOSIS — F338 Other recurrent depressive disorders: Secondary | ICD-10-CM

## 2022-07-10 DIAGNOSIS — F411 Generalized anxiety disorder: Secondary | ICD-10-CM

## 2022-07-19 ENCOUNTER — Ambulatory Visit (INDEPENDENT_AMBULATORY_CARE_PROVIDER_SITE_OTHER): Payer: 59 | Admitting: Psychiatry

## 2022-07-19 ENCOUNTER — Encounter: Payer: Self-pay | Admitting: Psychiatry

## 2022-07-19 DIAGNOSIS — F338 Other recurrent depressive disorders: Secondary | ICD-10-CM | POA: Diagnosis not present

## 2022-07-19 DIAGNOSIS — G479 Sleep disorder, unspecified: Secondary | ICD-10-CM

## 2022-07-19 DIAGNOSIS — F411 Generalized anxiety disorder: Secondary | ICD-10-CM

## 2022-07-19 MED ORDER — BUPROPION HCL ER (XL) 150 MG PO TB24: 150.0000 mg | ORAL_TABLET | Freq: Every day | ORAL | 2 refills | Status: DC

## 2022-07-19 MED ORDER — TEMAZEPAM 15 MG PO CAPS: 15.0000 mg | ORAL_CAPSULE | Freq: Every evening | ORAL | 1 refills | Status: DC | PRN

## 2022-07-19 NOTE — Progress Notes (Signed)
Jonathan Jonathan Key [id number] [birth year] 56 y.o.  Subjective:   Patient ID:  Jonathan Jonathan Key is a 56 y.o. (DOB 1966/08/31) male.  Chief Complaint:  Chief Complaint  Patient presents with   Follow-up    Anxiety, depression, insomnia    Jonathan Jonathan Key presents to the office today for follow-up of anxiety and depression.  Jonathan Jonathan Key, ie. 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 Jonathan denies excessive irritability. Denies affective dulling. Jonathan denies depressed mood. Jonathan reports that his energy has been slightly lower with onset of fall. Jonathan reports that his motivation has been ok. Jonathan reports that sleep has been good overall. Jonathan reports that his appetite has been "too good." Denies difficulty with concentration. Occ difficulty recalling certain things. Denies SI.   Jonathan has been at his job for 13 years.   Father is currently Jonathan Key the ER Jonathan Key [place]. Jonathan will likely head up tomorrow.   Temazepam last filled 04/22/22 for #90  Past Psychiatric Medication Trials: Temazepam- Helpful for insomnia Xanax- Helpful for insomnia Trazodone- Felt like Jonathan was Jonathan Key a fog Hydroxyzine- grogginess Belsomra Melatonin Zzzquil Paxil- unable to sleep Sertraline Wellbutrin XL May have tried Lexapro  PHQ2-9    Jonathan Jonathan Key [place] at [facility] Visit from 08/04/2019 Jonathan Key [facility] Office Visit from 10/03/2015 Jonathan Key [place]  PHQ-2 Total Score 0 0 0      [place] [facility] from 11/28/2021 Jonathan Key [facility]  ED from 11/15/2021 Jonathan Key [place] [facility]  ED from 04/20/2021 Jonathan Key [facility] [place] No Risk No Risk No Risk        Review of Systems:  Review of Systems  Gastrointestinal: Negative.   Musculoskeletal:  Negative for gait problem.       Occ back aches  Neurological:  Negative for tremors and  headaches.  Psychiatric/Behavioral:         [name]    Medications: I have reviewed the patient's current medications.  Current Outpatient Medications  Medication Sig Dispense Refill   escitalopram (LEXAPRO) 10 MG tablet TAKE 1 TABLET BY MOUTH DAILY 90 tablet 3   Fluocinonide 0.1 % CREA Apply topically.     loratadine (CLARITIN) 10 MG tablet Take 10 mg by mouth daily.     Multiple Vitamin (MULTIVITAMIN) tablet Take 1 tablet by mouth daily.     buPROPion (WELLBUTRIN XL) 150 MG 24 hr tablet Take 1 tablet (150 mg total) by mouth daily. 90 tablet 2   temazepam (RESTORIL) 15 MG capsule Take 1 capsule (15 mg total) by mouth at bedtime as needed for sleep. 90 capsule 1   No current facility-administered medications for this visit.    Medication Side Effects: None  Allergies:  Allergies  Allergen Reactions   Morphine And Related Rash    Past Medical History:  Diagnosis Date   Atopic dermatitis    COVID-19 09/2020   Hyperlipidemia    Hypertension    Plantar fasciitis     Past Medical History, Surgical history, Social history, and Family history were reviewed and updated as appropriate.   Please see review of systems for further details on the patient's review from today.   Objective:   Physical Exam:  BP 125/80   Pulse 80   Physical Exam Constitutional:      General:  Jonathan is not Jonathan Key acute distress. Musculoskeletal:        General: No deformity.  Neurological:     Mental Status: Jonathan is alert and oriented to person, place, and time.     Coordination: Coordination normal.  Psychiatric:        Attention and Perception: Attention and perception normal. Jonathan does not perceive auditory or visual hallucinations.        Mood and Affect: Mood normal. Mood is not anxious or depressed. Affect is not labile, blunt, angry or inappropriate.        Speech: Speech normal.        Behavior: Behavior normal.        Thought Content: Thought content normal. Thought content is not  paranoid or delusional. Thought content does not include homicidal or suicidal ideation. Thought content does not include homicidal or suicidal plan.        Cognition and Memory: Cognition and memory normal.        Judgment: Judgment normal.     Comments: Insight intact     Lab Review:     Component Value Date/Time   NA 138 03/01/2022 1443   K 4.4 03/01/2022 1443   CL 100 03/01/2022 1443   CO2 24 03/01/2022 1443   GLUCOSE 100 (H) 03/01/2022 1443   BUN 14 03/01/2022 1443   CREATININE 1.13 03/01/2022 1443   CALCIUM 9.5 03/01/2022 1443   PROT 7.0 03/01/2022 1443   ALBUMIN 4.8 03/01/2022 1443   AST 30 03/01/2022 1443   ALT 40 03/01/2022 1443   ALKPHOS 76 03/01/2022 1443   BILITOT 0.4 03/01/2022 1443       Component Value Date/Time   WBC 4.6 03/01/2022 1443   RBC 4.99 03/01/2022 1443   HGB 16.1 03/01/2022 1443   HCT 46.9 03/01/2022 1443   PLT 249 03/01/2022 1443   MCV 94 03/01/2022 1443   MCH 32.3 03/01/2022 1443   MCHC 34.3 03/01/2022 1443   RDW 12.3 03/01/2022 1443    No results found for: "POCLITH", "LITHIUM"   No results found for: "PHENYTOIN", "PHENOBARB", "VALPROATE", "CBMZ"   .res Assessment: Plan:   Continue Lexapro 10 mg po qd for depression and anxiety.  Continue Wellbutrin XL 150 mg po qd for depression.  Continue Temazepam 15 mg po QHS prn insomnia.  Pt to follow-up Jonathan Key 6 months or sooner if clinically indicated.  Patient advised to contact office with any questions, adverse effects, or acute worsening Jonathan Key signs and symptoms.   Jonathan Jonathan Key was seen today for follow-up.  Diagnoses and all orders for this visit:  Seasonal affective disorder (Jonathan Jonathan Key) -     buPROPion (WELLBUTRIN XL) 150 MG 24 hr tablet; Take 1 tablet (150 mg total) by mouth daily.  Generalized anxiety disorder  Sleep disturbance -     temazepam (RESTORIL) 15 MG capsule; Take 1 capsule (15 mg total) by mouth at bedtime as needed for sleep.     Please see After Visit Summary for patient specific  instructions.  Future Appointments  Date Time Provider [place]  01/17/2023 12:45 PM Thayer Headings, PMHNP [facility] None  06/19/2023  9:30 AM Ralene Bathe, MD [facility] None    No orders of the defined types were placed Jonathan Key this encounter.   -------------------------------

## 2022-12-30 ENCOUNTER — Other Ambulatory Visit: Payer: Self-pay | Admitting: Psychiatry

## 2022-12-30 DIAGNOSIS — G479 Sleep disorder, unspecified: Secondary | ICD-10-CM

## 2023-01-17 ENCOUNTER — Telehealth (INDEPENDENT_AMBULATORY_CARE_PROVIDER_SITE_OTHER): Payer: 59 | Admitting: Psychiatry

## 2023-01-17 ENCOUNTER — Encounter: Payer: Self-pay | Admitting: Psychiatry

## 2023-01-17 DIAGNOSIS — G479 Sleep disorder, unspecified: Secondary | ICD-10-CM

## 2023-01-17 DIAGNOSIS — F338 Other recurrent depressive disorders: Secondary | ICD-10-CM | POA: Diagnosis not present

## 2023-01-17 DIAGNOSIS — F411 Generalized anxiety disorder: Secondary | ICD-10-CM

## 2023-01-17 MED ORDER — ESCITALOPRAM OXALATE 10 MG PO TABS: 10.0000 mg | ORAL_TABLET | Freq: Every day | ORAL | 0 refills | Status: DC

## 2023-01-17 MED ORDER — BUPROPION HCL ER (XL) 150 MG PO TB24: 150.0000 mg | ORAL_TABLET | Freq: Every day | ORAL | 2 refills | Status: DC

## 2023-01-17 NOTE — Progress Notes (Signed)
Jonathan Key [id number] [birth year] 57 y.o.  Virtual Visit via Video Note  I connected with pt @ on 01/17/23 at 12:45 PM EDT by a video enabled telemedicine application and verified that I am speaking with the correct person using two identifiers.   I discussed the limitations of evaluation and management by telemedicine and the availability of in person appointments. The patient expressed understanding and agreed to proceed.  I discussed the assessment and treatment plan with the patient. The patient was provided an opportunity to ask questions and all were answered. The patient agreed with the plan and demonstrated an understanding of the instructions.   The patient was advised to call back or seek an in-person evaluation if the symptoms worsen or if the condition fails to improve as anticipated.  I provided 15 minutes of non-face-to-face time during this encounter.  The patient was located at home.  The provider was located at home.  Jonathan Key, PMHNP   Subjective:   Patient ID:  Jonathan Key is a 57 y.o. (DOB April 25, 1966) male.  Chief Complaint:  Chief Complaint  Patient presents with   Follow-up    Anxiety, depression, and insomnia    HPI Jonathan Key presents for follow-up of anxiety and depression. He reports that he has been doing well. "Everything seems to be going just fine." He reports that he has been gaining weight. He reports that he may have had anxiety a couple of times in response to family. Denies any panic attacks. He reports that he tries to avoid crowds. Denies depressed mood. Denies seasonal depression. Energy has been "great." He reports that his motivation is is good and he has been exercising regularly. He reports that his concentration has been good. He reports that he has been gaining weight. Sleeping well. He reports that he occ has brief periods of disrupted sleep in response to acute stressors. He reports that he has been consistently taking Temazepam at  bedtime. He notices he will have middle of the night awakening if he forgets to take Temazepam. Denies SI.  He has been going to [place] frequently to help with family. Dad had to have surgery recently. His parents and brothers. Wife's parents passed away.   Work has been going ok and work environment has improved.  Temazepam last filled on 01/01/23.   Past Psychiatric Medication Trials: Temazepam- Helpful for insomnia Xanax- Helpful for insomnia Trazodone- Felt like he was in a fog Hydroxyzine- grogginess Belsomra Melatonin Zzzquil Paxil- unable to sleep Sertraline Wellbutrin XL May have tried Lexapro   Review of Systems:  Review of Systems  Musculoskeletal:  Positive for arthralgias and myalgias. Negative for gait problem.  Skin:  Negative for rash.  Allergic/Immunologic: Positive for environmental allergies.  Psychiatric/Behavioral:         Please refer to HPI    Medications: I have reviewed the patient's current medications.  Current Outpatient Medications  Medication Sig Dispense Refill   Acetaminophen (TYLENOL) 325 MG CAPS Take by mouth.     Fluocinonide 0.1 % CREA Apply topically.     loratadine (CLARITIN) 10 MG tablet Take 10 mg by mouth daily.     Multiple Vitamin (MULTIVITAMIN) tablet Take 1 tablet by mouth daily.     temazepam (RESTORIL) 15 MG capsule TAKE 1 CAPSULE BY MOUTH AT  BEDTIME AS NEEDED FOR SLEEP 90 capsule 1   buPROPion (WELLBUTRIN XL) 150 MG 24 hr tablet Take 1 tablet (150 mg total) by mouth daily. 90 tablet 2   escitalopram (LEXAPRO)  10 MG tablet Take 1 tablet (10 mg total) by mouth daily. 90 tablet 0   No current facility-administered medications for this visit.    Medication Side Effects: None  Allergies:  Allergies  Allergen Reactions   Morphine And Related Rash    Past Medical History:  Diagnosis Date   Atopic dermatitis    COVID-19 09/2020   Hyperlipidemia    Hypertension    Plantar fasciitis     Family History  Problem  Relation Age of Onset   Stroke Father    Obesity Brother    Obesity Brother    Obesity Brother    Cancer Maternal Aunt 45       Breast Cancer    Diabetes Maternal Grandmother    Cancer Maternal Grandmother    Dementia Maternal Grandmother    Cancer Maternal Grandfather 38       Stomach Cancer   Parkinson's disease Paternal Grandfather     Social History   Socioeconomic History   Marital status: Married    Spouse name: Jonathan Key   Number of children: Not on file   Years of education: 15   Highest education level: Associate degree: occupational, Scientist, product/process development, or vocational program  Occupational History   Not on file  Tobacco Use   Smoking status: Never   Smokeless tobacco: Never  Vaping Use   Vaping Use: Never used  Substance and Sexual Activity   Alcohol use: Yes    Comment: Drinks beer on weekends 6 pack   Drug use: No   Sexual activity: Not on file  Other Topics Concern   Not on file  Social History Narrative   Jonathan Key grew up in [place]. 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 He worked in [employer] and moved quite often with them up and down the [employer]. He is currently working for [employer] as an Chief Financial Officer. He lives at home with his wife, Jonathan Key, of 15 years. They do not have any children. They have a cat. Jonathan Key enjoys yard work, traveling and riding motorcycle.    Social Determinants of Health   Financial Resource Strain: Not on file  Food Insecurity: Not on file  Transportation Needs: Not on file  Physical Activity: Not on file  Stress: Not on file  Social Connections: Not on file  Intimate Partner Violence: Not on file    Past Medical History, Surgical history, Social history, and Family history were reviewed and updated as appropriate.   Please see review of systems for further details on the patient's review from  today.   Objective:   Physical Exam:  There were no vitals taken for this visit.  Physical Exam Neurological:     Mental Status: He is alert and oriented to person, place, and time.     Cranial Nerves: No dysarthria.  Psychiatric:        Attention and Perception: Attention and perception normal.        Mood and Affect: Mood normal.        Speech: Speech normal.        Behavior: Behavior is cooperative.        Thought Content: Thought content normal. Thought content is not paranoid or delusional. Thought content does not include homicidal or suicidal ideation. Thought content does not include homicidal or suicidal plan.        Cognition and Memory:  Cognition and memory normal.        Judgment: Judgment normal.     Comments: Insight intact     Lab Review:     Component Value Date/Time   NA 138 03/01/2022 1443   K 4.4 03/01/2022 1443   CL 100 03/01/2022 1443   CO2 24 03/01/2022 1443   GLUCOSE 100 (H) 03/01/2022 1443   BUN 14 03/01/2022 1443   CREATININE 1.13 03/01/2022 1443   CALCIUM 9.5 03/01/2022 1443   PROT 7.0 03/01/2022 1443   ALBUMIN 4.8 03/01/2022 1443   AST 30 03/01/2022 1443   ALT 40 03/01/2022 1443   ALKPHOS 76 03/01/2022 1443   BILITOT 0.4 03/01/2022 1443       Component Value Date/Time   WBC 4.6 03/01/2022 1443   RBC 4.99 03/01/2022 1443   HGB 16.1 03/01/2022 1443   HCT 46.9 03/01/2022 1443   PLT 249 03/01/2022 1443   MCV 94 03/01/2022 1443   MCH 32.3 03/01/2022 1443   MCHC 34.3 03/01/2022 1443   RDW 12.3 03/01/2022 1443    No results found for: "POCLITH", "LITHIUM"   No results found for: "PHENYTOIN", "PHENOBARB", "VALPROATE", "CBMZ"   .res Assessment: Plan:    Will continue current plan of care since target signs and symptoms are well controlled without any tolerability issues. Continue Wellbutrin XL 150 mg po qd for depression.  Continue Lexapro 10 mg po qd for depression and anxiety.  Continue Temazepam 15 mg po QHS prn insomnia. Script  sent 01/01/23 for #90 with 1 refill.  Pt to follow-up in 6 months or sooner if clinically indicated.   Patient advised to contact office with any questions, adverse effects, or acute worsening in signs and symptoms.   [place] was seen today for follow-up.  Diagnoses and all orders for this visit:  Generalized anxiety disorder -     escitalopram (LEXAPRO) 10 MG tablet; Take 1 tablet (10 mg total) by mouth daily.  Sleep disturbance  Seasonal affective disorder -     buPROPion (WELLBUTRIN XL) 150 MG 24 hr tablet; Take 1 tablet (150 mg total) by mouth daily. -     escitalopram (LEXAPRO) 10 MG tablet; Take 1 tablet (10 mg total) by mouth daily.     Please see After Visit Summary for patient specific instructions.  Future Appointments  Date Time Provider Department Center  06/19/2023  9:30 AM Deirdre Evener, MD [facility] None    No orders of the defined types were placed in this encounter.     -------------------------------

## 2023-05-22 ENCOUNTER — Other Ambulatory Visit: Payer: Self-pay | Admitting: Psychiatry

## 2023-05-22 DIAGNOSIS — F411 Generalized anxiety disorder: Secondary | ICD-10-CM

## 2023-05-22 DIAGNOSIS — F338 Other recurrent depressive disorders: Secondary | ICD-10-CM

## 2023-06-04 ENCOUNTER — Other Ambulatory Visit: Payer: Self-pay | Admitting: Psychiatry

## 2023-06-04 DIAGNOSIS — G479 Sleep disorder, unspecified: Secondary | ICD-10-CM

## 2023-06-19 ENCOUNTER — Ambulatory Visit: Payer: Managed Care, Other (non HMO) | Admitting: Dermatology

## 2023-07-18 ENCOUNTER — Encounter: Payer: Self-pay | Admitting: Psychiatry

## 2023-07-18 ENCOUNTER — Ambulatory Visit: Payer: 59 | Admitting: Psychiatry

## 2023-07-18 DIAGNOSIS — F338 Other recurrent depressive disorders: Secondary | ICD-10-CM | POA: Diagnosis not present

## 2023-07-18 DIAGNOSIS — F411 Generalized anxiety disorder: Secondary | ICD-10-CM

## 2023-07-18 DIAGNOSIS — G479 Sleep disorder, unspecified: Secondary | ICD-10-CM

## 2023-07-18 MED ORDER — BUPROPION HCL ER (XL) 150 MG PO TB24: 150.0000 mg | ORAL_TABLET | Freq: Every day | ORAL | 2 refills | Status: AC

## 2023-07-18 MED ORDER — TEMAZEPAM 15 MG PO CAPS
15.0000 mg | ORAL_CAPSULE | Freq: Every day | ORAL | 1 refills | Status: AC
Start: 2023-09-09 — End: ?

## 2023-07-18 NOTE — Progress Notes (Signed)
Jonathan Key [id number] May 19, 1966 57 y.o.  Subjective:   Patient ID:  Jonathan Key is a 57 y.o. (DOB 17-Apr-1966) male.  Chief Complaint:  Chief Complaint  Patient presents with   Follow-up    Depression, anxiety, and insomnia    HPI Jonathan Key presents to the office today for follow-up of anxiety, depression, and insomnia. Denies concerns or complaints. Jonathan Key denies anxiety other than when Jonathan Key was called for jury duty for 2 days. Denies depressed mood. Jonathan Key reports that Jonathan Key had difficulty sleeping last week. Jonathan Key reports that Jonathan Key has been sleeping very well this week. Energy and motivation have been good. Concentration has been good. No change in appetite. Denies anhedonia. Jonathan Key has been enjoying  getting outside and going to festivals. Denies SI.   Jonathan Key reports that Jonathan Key work moved to a new, larger facility. Plans to visit mom and brothers in [place].   Enjoying change in weather.   Temazepam last filled 06/17/23 for 90 day supply.   Past Psychiatric Medication Trials: Temazepam- Helpful for insomnia Xanax- Helpful for insomnia Trazodone- Felt like Jonathan Key was in a fog Hydroxyzine- grogginess Belsomra Melatonin Zzzquil Paxil- unable to sleep Sertraline Wellbutrin XL May have tried Lexapro   PHQ2-9    Flowsheet Row Office Visit from 03/01/2022 in [facility] Visit from 08/04/2019 in [facility] Office Visit from 10/03/2015 in [facility] Total Score 0 0 0      Flowsheet Row ED from 11/28/2021 in [facility]  ED from 11/15/2021 in [facility] at [facility]  ED from 04/20/2021 in [facility] at [facility]   C-SSRS RISK CATEGORY No Risk No Risk No Risk        Review of Systems:  Review of Systems  Musculoskeletal:  Negative for gait problem.  Neurological:  Negative for headaches.  Psychiatric/Behavioral:         Please refer to HPI     Medications: I have reviewed the patient's current medications.  Current Outpatient Medications  Medication Sig Dispense Refill   escitalopram (LEXAPRO) 10 MG tablet TAKE 1 TABLET BY MOUTH DAILY 90 tablet 3   loratadine (CLARITIN) 10 MG tablet Take 10 mg by mouth daily.     Multiple Vitamin (MULTIVITAMIN) tablet Take 1 tablet by mouth daily.     Acetaminophen (TYLENOL) 325 MG CAPS Take by mouth as needed.     buPROPion (WELLBUTRIN XL) 150 MG 24 hr tablet Take 1 tablet (150 mg total) by mouth daily. 90 tablet 2   Fluocinonide 0.1 % CREA Apply topically.     [START ON 09/09/2023] temazepam (RESTORIL) 15 MG capsule Take 1 capsule (15 mg total) by mouth at bedtime. 90 capsule 1   No current facility-administered medications for this visit.    Medication Side Effects: None  Allergies:  Allergies  Allergen Reactions   Morphine And Codeine Rash    Past Medical History:  Diagnosis Date   Atopic dermatitis    COVID-19 09/2020   Hyperlipidemia    Hypertension    Plantar fasciitis     Past Medical History, Surgical history, Social history, and Family history were reviewed and updated as appropriate.   Please see review of systems for further details on the patient's review from today.   Objective:   Physical Exam:  There were no vitals taken for this visit.  Physical Exam Constitutional:  General: Jonathan Key is not in acute distress. Musculoskeletal:        General: No deformity.  Neurological:     Mental Status: Jonathan Key is alert and oriented to person, place, and time.     Coordination: Coordination normal.  Psychiatric:        Attention and Perception: Attention and perception normal. Jonathan Key does not perceive auditory or visual hallucinations.        Mood and Affect: Mood normal. Mood is not anxious or depressed. Affect is not labile, blunt, angry or inappropriate.        Speech: Speech normal.        Behavior: Behavior normal.        Thought Content: Thought content normal.  Thought content is not paranoid or delusional. Thought content does not include homicidal or suicidal ideation. Thought content does not include homicidal or suicidal plan.        Cognition and Memory: Cognition and memory normal.        Judgment: Judgment normal.     Comments: Insight intact     Lab Review:     Component Value Date/Time   NA 138 03/01/2022 1443   K 4.4 03/01/2022 1443   CL 100 03/01/2022 1443   CO2 24 03/01/2022 1443   GLUCOSE 100 (H) 03/01/2022 1443   BUN 14 03/01/2022 1443   CREATININE 1.13 03/01/2022 1443   CALCIUM 9.5 03/01/2022 1443   PROT 7.0 03/01/2022 1443   ALBUMIN 4.8 03/01/2022 1443   AST 30 03/01/2022 1443   ALT 40 03/01/2022 1443   ALKPHOS 76 03/01/2022 1443   BILITOT 0.4 03/01/2022 1443       Component Value Date/Time   WBC 4.6 03/01/2022 1443   RBC 4.99 03/01/2022 1443   HGB 16.1 03/01/2022 1443   HCT 46.9 03/01/2022 1443   PLT 249 03/01/2022 1443   MCV 94 03/01/2022 1443   MCH 32.3 03/01/2022 1443   MCHC 34.3 03/01/2022 1443   RDW 12.3 03/01/2022 1443    No results found for: "POCLITH", "LITHIUM"   No results found for: "PHENYTOIN", "PHENOBARB", "VALPROATE", "CBMZ"   .res Assessment: Plan:    Will continue current plan of care since target signs and symptoms are well controlled without any tolerability issues. Continue Lexapro 10 mg daily for depression and anxiety.  Continue Wellbutrin XL 150 mg daily for depression.  Continue Temazepam 15 mg po at bedtime for insomnia.  Pt to follow-up in 6 months or sooner if clinically indicated.  Patient advised to contact office with any questions, adverse effects, or acute worsening in signs and symptoms.   Jonathan Key was seen today for follow-up.  Diagnoses and all orders for this visit:  Seasonal affective disorder (HCC) -     buPROPion (WELLBUTRIN XL) 150 MG 24 hr tablet; Take 1 tablet (150 mg total) by mouth daily.  Sleep disturbance -     temazepam (RESTORIL) 15 MG capsule; Take 1  capsule (15 mg total) by mouth at bedtime.     Please see After Visit Summary for patient specific instructions.  Future Appointments  Date Time Provider Department Center  10/29/2023  3:30 PM Deirdre Evener, MD [facility] None  01/08/2024 10:00 AM Corie Chiquito, PMHNP [facility] None    No orders of the defined types were placed in this encounter.   -------------------------------

## 2023-08-13 ENCOUNTER — Encounter: Payer: Self-pay | Admitting: Psychiatry

## 2023-10-13 ENCOUNTER — Encounter: Payer: Self-pay | Admitting: Nurse Practitioner

## 2023-10-13 ENCOUNTER — Ambulatory Visit (INDEPENDENT_AMBULATORY_CARE_PROVIDER_SITE_OTHER): Payer: Managed Care, Other (non HMO) | Admitting: Nurse Practitioner

## 2023-10-13 VITALS — BP 138/88 | HR 100 | Temp 98.1°F | Ht 69.0 in | Wt 229.6 lb

## 2023-10-13 DIAGNOSIS — M5432 Sciatica, left side: Secondary | ICD-10-CM | POA: Insufficient documentation

## 2023-10-13 DIAGNOSIS — M7072 Other bursitis of hip, left hip: Secondary | ICD-10-CM | POA: Diagnosis not present

## 2023-10-13 MED ORDER — PREDNISONE 20 MG PO TABS: ORAL_TABLET | ORAL | 0 refills | Status: AC

## 2023-10-13 NOTE — Assessment & Plan Note (Signed)
 Prednisone taper as directed.  Prednisone precautions reviewed.  Given patient some rehab exercises to try at home if no improvement follow-up with Dr. Karleen Hampshire [facility] medicine office

## 2023-10-13 NOTE — Progress Notes (Signed)
 Acute Office Visit  Subjective:     Patient ID: Jonathan Key, male    DOB: 1966/06/13, 58 y.o.   MRN: [id number]  Chief Complaint  Patient presents with   Hip Pain    Pt complains of left hip pain that started a little over a month ago. Sharp and dull pain. Hard to sleep at night. States that he sometimes has pain when walking, sitting, and standing.     Patient is in today for hip pain with a history of chronic bilateral back pain without sciaitica   Lumbar picture onf 03/01/2022 that showed degenerative changes  States that it started aprox over 1 month  ago. States that it feels that it moves around. States that it is hard to sit and will have trouble getting comfortable at night will sleep.  States that he has had sharp pain that is rate and infrequent. States at night is lateral hip He has tried tylenol that has helped some    Review of Systems  Constitutional:  Negative for chills and fever.  Respiratory:  Negative for shortness of breath.   Cardiovascular:  Negative for chest pain.  Musculoskeletal:  Positive for joint pain.  Neurological:  Negative for tingling and headaches.        Objective:    BP 138/88   Pulse 100   Temp 98.1 F (36.7 C) (Oral)   Ht 5' 9 (1.753 m)   Wt 229 lb 9.6 oz (104.1 kg)   SpO2 98%   BMI 33.91 kg/m    Physical Exam Vitals and nursing note reviewed.  Constitutional:      Appearance: Normal appearance.  Cardiovascular:     Rate and Rhythm: Normal rate and regular rhythm.     Heart sounds: Normal heart sounds.  Pulmonary:     Effort: Pulmonary effort is normal.     Breath sounds: Normal breath sounds.  Musculoskeletal:     Cervical back: Decreased range of motion.     Lumbar back: No tenderness or bony tenderness. Positive left straight leg raise test. Negative right straight leg raise test.     Right hip: Normal strength.     Comments: Bilateral PT pulse 2+  Left hip oan with abduction and adduction Full extension, no  pain   Neurological:     Mental Status: He is alert.     No results found for any visits on 10/13/23.      Assessment & Plan:   Problem List Items Addressed This Visit       Nervous and Auditory   Sciatica of left side - Primary   Straight leg raise positive in office.  Prednisone  taper as directed.  Prednisone  precautions reviewed.  Sciatica rehab exercises given at discharge.  If no improvement follow-up with Dr. Jacques [facility] sports medicine office.      Relevant Medications   predniSONE  (DELTASONE ) 20 MG tablet     Musculoskeletal and Integument   Bursitis of left hip   Prednisone  taper as directed.  Prednisone  precautions reviewed.  Given patient some rehab exercises to try at home if no improvement follow-up with Dr. Jacques [facility] sports medicine office      Relevant Medications   predniSONE  (DELTASONE ) 20 MG tablet    Meds ordered this encounter  Medications   predniSONE  (DELTASONE ) 20 MG tablet    Sig: Take 2 tablets (40 mg total) by mouth daily with breakfast for 3 days, THEN 1 tablet (20 mg total) daily with breakfast for  3 days.    Dispense:  9 tablet    Refill:  0    Supervising Provider:   RANDEEN HARDY A [[id number]]    Return in about 4 weeks (around 11/10/2023) for CPE and Labs.  Adina Crandall, NP

## 2023-10-13 NOTE — Assessment & Plan Note (Signed)
 Straight leg raise positive in office.  Prednisone taper as directed.  Prednisone precautions reviewed.  Sciatica rehab exercises given at discharge.  If no improvement follow-up with Dr. Karleen Hampshire [facility] office.

## 2023-10-13 NOTE — Patient Instructions (Signed)
 Nice to see you today I have sent in medication to the pharmacy  Avoid NSAIDs while in them Follow up with me in 4-6 weeks for your physical, sooner if you need me

## 2023-10-29 ENCOUNTER — Ambulatory Visit: Payer: Managed Care, Other (non HMO) | Admitting: Dermatology

## 2023-11-05 ENCOUNTER — Encounter: Payer: Self-pay | Admitting: Dermatology

## 2023-11-05 ENCOUNTER — Ambulatory Visit: Payer: Managed Care, Other (non HMO) | Admitting: Dermatology

## 2023-11-05 DIAGNOSIS — L814 Other melanin hyperpigmentation: Secondary | ICD-10-CM

## 2023-11-05 DIAGNOSIS — L578 Other skin changes due to chronic exposure to nonionizing radiation: Secondary | ICD-10-CM | POA: Diagnosis not present

## 2023-11-05 DIAGNOSIS — L821 Other seborrheic keratosis: Secondary | ICD-10-CM

## 2023-11-05 DIAGNOSIS — W908XXA Exposure to other nonionizing radiation, initial encounter: Secondary | ICD-10-CM

## 2023-11-05 DIAGNOSIS — Z1283 Encounter for screening for malignant neoplasm of skin: Secondary | ICD-10-CM

## 2023-11-05 DIAGNOSIS — Z872 Personal history of diseases of the skin and subcutaneous tissue: Secondary | ICD-10-CM

## 2023-11-05 DIAGNOSIS — D229 Melanocytic nevi, unspecified: Secondary | ICD-10-CM

## 2023-11-05 DIAGNOSIS — D1801 Hemangioma of skin and subcutaneous tissue: Secondary | ICD-10-CM

## 2023-11-05 NOTE — Patient Instructions (Addendum)

## 2023-11-05 NOTE — Progress Notes (Signed)
   Follow-Up Visit   Subjective  Jonathan Key is a 58 y.o. male who presents for the following: Yearly Skin Cancer Screening and Upper Body Skin Exam, hx of precancers   The patient presents for Upper Body Skin Exam (UBSE) for skin cancer screening and mole check. The patient has spots, moles and lesions to be evaluated, some may be new or changing and the patient may have concern these could be cancer.    The following portions of the chart were reviewed this encounter and updated as appropriate: medications, allergies, medical history  Review of Systems:  No other skin or systemic complaints except as noted in HPI or Assessment and Plan.  Objective  Well appearing patient in no apparent distress; mood and affect are within normal limits.  All skin waist up examined. Relevant physical exam findings are noted in the Assessment and Plan.    Assessment & Plan   LENTIGINES   MULTIPLE BENIGN NEVI   SEBORRHEIC KERATOSES   ACTINIC ELASTOSIS   CHERRY ANGIOMA   Skin cancer screening performed today.  Actinic Damage - Chronic condition, secondary to cumulative UV/sun exposure - diffuse scaly erythematous macules with underlying dyspigmentation - Recommend daily broad spectrum sunscreen SPF 30+ to sun-exposed areas, reapply every 2 hours as needed.  - Staying in the shade or wearing long sleeves, sun glasses (UVA+UVB protection) and wide brim hats (4-inch brim around the entire circumference of the hat) are also recommended for sun protection.  - Call for new or changing lesions.  Lentigines, Seborrheic Keratoses, Hemangiomas - Benign normal skin lesions - Benign-appearing - Call for any changes  Melanocytic Nevi - Tan-brown and/or pink-flesh-colored symmetric macules and papules - Benign appearing on exam today - Observation - Call clinic for new or changing moles - Recommend daily use of broad spectrum spf 30+ sunscreen to sun-exposed areas.     Return in about 1  year (around 11/04/2024) for UBSE, hx of AKs .  IFay Kirks, CMA, am acting as scribe for Boneta Sharps, MD .   Documentation: I have reviewed the above documentation for accuracy and completeness, and I agree with the above.  Boneta Sharps, MD

## 2023-11-26 ENCOUNTER — Encounter: Payer: Managed Care, Other (non HMO) | Admitting: Nurse Practitioner

## 2023-12-04 ENCOUNTER — Encounter: Payer: Self-pay | Admitting: Nurse Practitioner

## 2023-12-04 ENCOUNTER — Ambulatory Visit (INDEPENDENT_AMBULATORY_CARE_PROVIDER_SITE_OTHER): Payer: Managed Care, Other (non HMO) | Admitting: Nurse Practitioner

## 2023-12-04 VITALS — BP 126/80 | HR 100 | Temp 98.1°F | Ht 69.5 in | Wt 231.0 lb

## 2023-12-04 DIAGNOSIS — E785 Hyperlipidemia, unspecified: Secondary | ICD-10-CM

## 2023-12-04 DIAGNOSIS — Z0000 Encounter for general adult medical examination without abnormal findings: Secondary | ICD-10-CM | POA: Diagnosis not present

## 2023-12-04 DIAGNOSIS — Z125 Encounter for screening for malignant neoplasm of prostate: Secondary | ICD-10-CM

## 2023-12-04 DIAGNOSIS — Z114 Encounter for screening for human immunodeficiency virus [HIV]: Secondary | ICD-10-CM | POA: Diagnosis not present

## 2023-12-04 DIAGNOSIS — Z23 Encounter for immunization: Secondary | ICD-10-CM

## 2023-12-04 DIAGNOSIS — Z1159 Encounter for screening for other viral diseases: Secondary | ICD-10-CM

## 2023-12-04 DIAGNOSIS — G479 Sleep disorder, unspecified: Secondary | ICD-10-CM | POA: Diagnosis not present

## 2023-12-04 DIAGNOSIS — E669 Obesity, unspecified: Secondary | ICD-10-CM

## 2023-12-04 DIAGNOSIS — Z131 Encounter for screening for diabetes mellitus: Secondary | ICD-10-CM

## 2023-12-04 NOTE — Patient Instructions (Signed)
 Nice to see you today I will be in touch with the labs once I have reviewed them Follow up with me in 1 year, sooner if you need me   Make a nurse visit for 3 months for your second and final singles vaccine  Dr. Karleen Hampshire Copland if you leg does not continue to get better

## 2023-12-04 NOTE — Progress Notes (Signed)
 Established Patient Office Visit  Subjective   Patient ID: Jonathan Key, male    DOB: Sep 10, 1966  Age: 58 y.o. MRN: [id number]  Chief Complaint  Patient presents with   Annual Exam    HPI  for complete physical and follow up of chronic conditions.   Seasonal affective disorder/GAD: Patient is followed by Corie Chiquito, NP currently prescribed Wellbutrin 150 mg daily, Lexapro 10 mg daily, temazepam 15 mg at night. He is followed by her yearly    Immunizations: -Tetanus: Completed in 2016 -Influenza: refused  -Shingles: First dose in office today -Pneumonia: Too young  Diet: Fair diet. He is eating 3 meals a day and not a big snacker. If he does it is a healthy snack. He will drink water, coffee.  Exercise: No regular exercise. Tries to do some every mornign . Has a recumbent bike and weights.   Eye exam: Completes annually.  [facility] exam: Completes semi-annually    Colonoscopy: Completed in completed in 2022 with 10-year recall due 2032 Lung Cancer Screening: N/A  PSA: Due  Sleep: goes to bed around 9 and will get up around 5. Feels rested sometimes. Does not snore     Review of Systems  Constitutional:  Negative for chills and fever.  Respiratory:  Negative for shortness of breath.   Cardiovascular:  Negative for chest pain and leg swelling.  Gastrointestinal:  Negative for abdominal pain, blood in stool, constipation, diarrhea, nausea and vomiting.       BM daily   Genitourinary:  Negative for dysuria and hematuria.  Neurological:  Positive for tingling. Negative for headaches.  Psychiatric/Behavioral:  Negative for hallucinations and suicidal ideas.       Objective:     BP 126/80   Pulse 100   Temp 98.1 F (36.7 C) (Oral)   Ht 5' 9.5" (1.765 m)   Wt 231 lb (104.8 kg)   SpO2 95%   BMI 33.62 kg/m  BP Readings from Last 3 Encounters:  12/04/23 126/80  10/13/23 138/88  03/01/22 130/82   Wt Readings from Last 3 Encounters:  12/04/23  231 lb (104.8 kg)  10/13/23 229 lb 9.6 oz (104.1 kg)  03/01/22 218 lb 4 oz (99 kg)   SpO2 Readings from Last 3 Encounters:  12/04/23 95%  10/13/23 98%  03/01/22 96%      Physical Exam Vitals and nursing note reviewed.  Constitutional:      Appearance: Normal appearance.  HENT:     Right Ear: Tympanic membrane, ear canal and external ear normal.     Left Ear: Tympanic membrane, ear canal and external ear normal.     Mouth/Throat:     Mouth: Mucous membranes are moist.     Pharynx: Oropharynx is clear.  Eyes:     Extraocular Movements: Extraocular movements intact.     Pupils: Pupils are equal, round, and reactive to light.  Cardiovascular:     Rate and Rhythm: Normal rate and regular rhythm.     Pulses: Normal pulses.     Heart sounds: Normal heart sounds.  Pulmonary:     Effort: Pulmonary effort is normal.     Breath sounds: Normal breath sounds.  Abdominal:     General: Bowel sounds are normal. There is no distension.     Palpations: There is no mass.     Tenderness: There is no abdominal tenderness.     Hernia: No hernia is present.  Genitourinary:    Comments: Deferred  Musculoskeletal:  Right lower leg: No edema.     Left lower leg: No edema.  Lymphadenopathy:     Cervical: No cervical adenopathy.  Skin:    General: Skin is warm.  Neurological:     General: No focal deficit present.     Mental Status: He is alert.     Deep Tendon Reflexes:     Reflex Scores:      Bicep reflexes are 2+ on the right side and 2+ on the left side.      Patellar reflexes are 2+ on the right side and 2+ on the left side.    Comments: Bilateral upper and lower extremity strength 5/5  Psychiatric:        Mood and Affect: Mood normal.        Behavior: Behavior normal.        Thought Content: Thought content normal.        Judgment: Judgment normal.      No results found for any visits on 12/04/23.    The 10-year ASCVD risk score (Arnett DK, et al., 2019) is: 7.9%     Assessment & Plan:   Problem List Items Addressed This Visit       Other   Preventative health care - Primary   Discussed age-appropriate immunizations and screening exams.  Did review patient's personal, surgical, social, family histories.  Patient is up-to-date on all age-appropriate vaccinations.  He refused flu vaccine today.  Administered first shingles vaccine in office today.  Patient is up-to-date on CRC screening.  Patient will have PSA drawn for prostate cancer screening today.  Patient was given information at discharge about preventative healthcare maintenance with anticipatory guidance.      Relevant Orders   TSH   Sleep disturbance   Patient currently followed by Corie Chiquito, NP and maintained on Restoril 15 mg nightly.  Continue following with specialist as recommended continuing meds medication as prescribed      Hyperlipidemia with target LDL less than 100   History of the same.  Pending lipid panel today      Relevant Orders   CBC   Comprehensive metabolic panel   Lipid panel   Obesity (BMI 30-39.9)   Pending TSH, A1c, lipid panel.  Continue work on healthy lifestyle modifications      Relevant Orders   Hemoglobin A1c   TSH   Other Visit Diagnoses       Encounter for hepatitis C screening test for low risk patient       Relevant Orders   Hepatitis C Antibody     Encounter for screening for HIV       Relevant Orders   HIV antibody (with reflex)     Need for shingles vaccine       Relevant Orders   Zoster Recombinant (Shingrix ) (Completed)     Screening for diabetes mellitus       Relevant Orders   Hemoglobin A1c     Screening for prostate cancer       Relevant Orders   PSA       Return in about 1 year (around 12/03/2024) for CPE and Labs.    Jonathan Nine, NP

## 2023-12-04 NOTE — Assessment & Plan Note (Signed)
 Pending TSH, A1c, lipid panel.  Continue work on healthy lifestyle modifications.

## 2023-12-04 NOTE — Assessment & Plan Note (Signed)
 History of the same.  Pending lipid panel today

## 2023-12-04 NOTE — Assessment & Plan Note (Signed)
 Patient currently followed by Corie Chiquito, NP and maintained on Restoril 15 mg nightly.  Continue following with specialist as recommended continuing meds medication as prescribed

## 2023-12-04 NOTE — Assessment & Plan Note (Signed)
 Discussed age-appropriate immunizations and screening exams.  Did review patient's personal, surgical, social, family histories.  Patient is up-to-date on all age-appropriate vaccinations.  He refused flu vaccine today.  Administered first shingles vaccine in office today.  Patient is up-to-date on CRC screening.  Patient will have PSA drawn for prostate cancer screening today.  Patient was given information at discharge about preventative healthcare maintenance with anticipatory guidance.

## 2023-12-05 LAB — LIPID PANEL
Chol/HDL Ratio: 3.9 ratio (ref 0.0–5.0)
Cholesterol, Total: 303 mg/dL — ABNORMAL HIGH (ref 100–199)
HDL: 77 mg/dL (ref >39)
LDL Chol Calc (NIH): 180 mg/dL — ABNORMAL HIGH (ref 0–99)
Triglycerides: 245 mg/dL — ABNORMAL HIGH (ref 0–149)
VLDL Cholesterol Cal: 46 mg/dL — ABNORMAL HIGH (ref 5–40)

## 2023-12-05 LAB — COMPREHENSIVE METABOLIC PANEL
ALT: 33 IU/L (ref 0–44)
AST: 28 IU/L (ref 0–40)
Albumin: 4.4 g/dL (ref 3.8–4.9)
Alkaline Phosphatase: 76 IU/L (ref 44–121)
BUN/Creatinine Ratio: 16 (ref 9–20)
BUN: 19 mg/dL (ref 6–24)
Bilirubin Total: 0.3 mg/dL (ref 0.0–1.2)
CO2: 22 mmol/L (ref 20–29)
Calcium: 9.6 mg/dL (ref 8.7–10.2)
Chloride: 103 mmol/L (ref 96–106)
Creatinine, Ser: 1.22 mg/dL (ref 0.76–1.27)
Globulin, Total: 2.6 g/dL (ref 1.5–4.5)
Glucose: 93 mg/dL (ref 70–99)
Potassium: 4.3 mmol/L (ref 3.5–5.2)
Sodium: 141 mmol/L (ref 134–144)
Total Protein: 7 g/dL (ref 6.0–8.5)
eGFR: 69 mL/min/{1.73_m2} (ref >59)

## 2023-12-05 LAB — CBC
Hematocrit: 46.2 % (ref 37.5–51.0)
Hemoglobin: 16.3 g/dL (ref 13.0–17.7)
MCH: 32.6 pg (ref 26.6–33.0)
MCHC: 35.3 g/dL (ref 31.5–35.7)
MCV: 92 fL (ref 79–97)
Platelets: 296 10*3/uL (ref 150–450)
RBC: 5 x10E6/uL (ref 4.14–5.80)
RDW: 12.2 % (ref 11.6–15.4)
WBC: 5.3 10*3/uL (ref 3.4–10.8)

## 2023-12-05 LAB — HIV ANTIBODY (ROUTINE TESTING W REFLEX): HIV Screen 4th Generation wRfx: NONREACTIVE

## 2023-12-05 LAB — PSA: Prostate Specific Ag, Serum: 0.8 ng/mL (ref 0.0–4.0)

## 2023-12-05 LAB — TSH: TSH: 2.96 u[IU]/mL (ref 0.450–4.500)

## 2023-12-05 LAB — HEMOGLOBIN A1C
Est. average glucose Bld gHb Est-mCnc: 114 mg/dL
Hgb A1c MFr Bld: 5.6 % (ref 4.8–5.6)

## 2023-12-05 LAB — HEPATITIS C ANTIBODY: Hep C Virus Ab: NONREACTIVE

## 2023-12-08 ENCOUNTER — Encounter: Payer: Self-pay | Admitting: Nurse Practitioner

## 2023-12-08 DIAGNOSIS — E785 Hyperlipidemia, unspecified: Secondary | ICD-10-CM

## 2023-12-08 MED ORDER — ROSUVASTATIN CALCIUM 10 MG PO TABS: 10.0000 mg | ORAL_TABLET | Freq: Every day | ORAL | 1 refills | Status: DC

## 2023-12-08 NOTE — Telephone Encounter (Signed)
 Needs 3 month fasting lab appointment please. Lab orders have been placed

## 2023-12-08 NOTE — Telephone Encounter (Signed)
 [email or web address]

## 2024-01-08 ENCOUNTER — Ambulatory Visit: Payer: 59 | Admitting: Psychiatry

## 2024-03-09 ENCOUNTER — Other Ambulatory Visit (INDEPENDENT_AMBULATORY_CARE_PROVIDER_SITE_OTHER)

## 2024-03-09 ENCOUNTER — Ambulatory Visit

## 2024-03-09 ENCOUNTER — Ambulatory Visit (INDEPENDENT_AMBULATORY_CARE_PROVIDER_SITE_OTHER)

## 2024-03-09 DIAGNOSIS — E785 Hyperlipidemia, unspecified: Secondary | ICD-10-CM | POA: Diagnosis not present

## 2024-03-09 DIAGNOSIS — Z23 Encounter for immunization: Secondary | ICD-10-CM

## 2024-03-09 NOTE — Progress Notes (Signed)
 Per orders of Winthrop Hawks, NP , who is out of office and Felicita Horns FNP who is in office injection of shingrix  given by Claretha Crocker in right deltoid. Patient tolerated injection well.

## 2024-03-10 ENCOUNTER — Ambulatory Visit: Payer: Self-pay | Admitting: Nurse Practitioner

## 2024-03-10 DIAGNOSIS — E785 Hyperlipidemia, unspecified: Secondary | ICD-10-CM

## 2024-03-10 LAB — LIPID PANEL
Chol/HDL Ratio: 3.2 ratio (ref 0.0–5.0)
Cholesterol, Total: 226 mg/dL — ABNORMAL HIGH (ref 100–199)
HDL: 71 mg/dL (ref >39)
LDL Chol Calc (NIH): 108 mg/dL — ABNORMAL HIGH (ref 0–99)
Triglycerides: 279 mg/dL — ABNORMAL HIGH (ref 0–149)
VLDL Cholesterol Cal: 47 mg/dL — ABNORMAL HIGH (ref 5–40)

## 2024-03-10 LAB — HEPATIC FUNCTION PANEL
ALT: 44 IU/L (ref 0–44)
AST: 35 IU/L (ref 0–40)
Albumin: 4.7 g/dL (ref 3.8–4.9)
Alkaline Phosphatase: 74 IU/L (ref 44–121)
Bilirubin Total: 0.5 mg/dL (ref 0.0–1.2)
Bilirubin, Direct: 0.2 mg/dL (ref 0.00–0.40)
Total Protein: 7 g/dL (ref 6.0–8.5)

## 2024-03-15 MED ORDER — ROSUVASTATIN CALCIUM 10 MG PO TABS: 10.0000 mg | ORAL_TABLET | Freq: Every day | ORAL | 1 refills | Status: DC

## 2024-05-03 ENCOUNTER — Ambulatory Visit: Admitting: Nurse Practitioner

## 2024-05-03 VITALS — BP 124/84 | HR 80 | Temp 97.8°F | Ht 69.5 in | Wt 234.0 lb

## 2024-05-03 DIAGNOSIS — M25819 Other specified joint disorders, unspecified shoulder: Secondary | ICD-10-CM | POA: Diagnosis not present

## 2024-05-03 DIAGNOSIS — M7711 Lateral epicondylitis, right elbow: Secondary | ICD-10-CM | POA: Diagnosis not present

## 2024-05-03 MED ORDER — MELOXICAM 15 MG PO TABS: 15.0000 mg | ORAL_TABLET | Freq: Every day | ORAL | 0 refills | Status: DC

## 2024-05-03 NOTE — Patient Instructions (Signed)
 Nice to see you today Do the shoulder exercises to help with that discomfort  Use the meloxicam  every day with food for 10 days. Then you can use it as needed there after  You can purchase a tennis elbow support at the store and use it when you are working

## 2024-05-03 NOTE — Assessment & Plan Note (Signed)
 Shoulder rehab exercises given in office.  Also prescribed meloxicam  for epicondylitis this will also help.

## 2024-05-03 NOTE — Progress Notes (Signed)
 Acute Office Visit  Subjective:     Patient ID: Jonathan Key, male    DOB: [birth year], 58 y.o.   MRN: [id number]  Chief Complaint  Patient presents with   Elbow Pain    Pt complains of R elbow pain that started some time last week. Sharp and dull pain. Pt states of pain regardless of using arm or not. Slight tingling/numbness.     HPI Patient is in today for elbow pain With a history of sciatica, bursitis, HLD, obesity.  Symptoms started last week  at the begnning. States that it was a nagging pain with normal routiene. States that sleeping at night is terribel. He is complaining of the shoulder and the elbow hurting States that when he raises his arm he fells it tight in the shoulder and the elbow with   He he has tried a tylenol. States that it did not help much   States that it is a dull ache with rest and sharp pain with movement  Patient is a Retail banker for employment  Review of Systems  Constitutional:  Negative for chills and fever.  Respiratory:  Negative for shortness of breath.   Cardiovascular:  Negative for chest pain.  Neurological:  Positive for tingling. Negative for weakness and headaches.        Objective:    BP 124/84   Pulse 80   Temp 97.8 F (36.6 C) (Oral)   Ht 5' 9.5 (1.765 m)   Wt 234 lb (106.1 kg)   SpO2 98%   BMI 34.06 kg/m  BP Readings from Last 3 Encounters:  05/03/24 124/84  12/04/23 126/80  10/13/23 138/88   Wt Readings from Last 3 Encounters:  05/03/24 234 lb (106.1 kg)  12/04/23 231 lb (104.8 kg)  10/13/23 229 lb 9.6 oz (104.1 kg)   SpO2 Readings from Last 3 Encounters:  05/03/24 98%  12/04/23 95%  10/13/23 98%      Physical Exam Vitals and nursing note reviewed.  Constitutional:      Appearance: Normal appearance.  Cardiovascular:     Rate and Rhythm: Normal rate and regular rhythm.     Pulses:          Radial pulses are 2+ on the right side and 2+ on the left side.     Heart sounds: Normal heart sounds.   Pulmonary:     Effort: Pulmonary effort is normal.     Breath sounds: Normal breath sounds.  Musculoskeletal:        General: Tenderness present.     Right shoulder: Tenderness present. Normal range of motion. Normal strength. Normal pulse.     Right elbow: Tenderness present in lateral epicondyle.     Comments: Positive empty can sign Positive Hawkins-Kennedy test  Neurological:     Mental Status: He is alert.     Deep Tendon Reflexes:     Reflex Scores:      Bicep reflexes are 2+ on the right side and 2+ on the left side.    Comments: Bilateral upper extremity strength 5/5     No results found for any visits on 05/03/24.      Assessment & Plan:   Problem List Items Addressed This Visit       Musculoskeletal and Integument   Epicondylitis, lateral, right - Primary   Patient will try to rest is much as possible use auto offloading band while working.  Take meloxicam  15 mg daily 7 to 10 days with food then  as needed thereafter.  No improvement plan to follow-up with Dr. Watt      Relevant Medications   meloxicam  (MOBIC ) 15 MG tablet     Other   Shoulder impingement   Shoulder rehab exercises given in office.  Also prescribed meloxicam  for epicondylitis this will also help.       Meds ordered this encounter  Medications   meloxicam  (MOBIC ) 15 MG tablet    Sig: Take 1 tablet (15 mg total) by mouth daily.    Dispense:  30 tablet    Refill:  0    Supervising Provider:   RANDEEN HARDY A [[id number]]    Return in about 7 months (around 12/01/2024) for CPE and Labs.  Jonathan Crandall, NP

## 2024-05-03 NOTE — Assessment & Plan Note (Signed)
 Patient will try to rest is much as possible use auto offloading band while working.  Take meloxicam  15 mg daily 7 to 10 days with food then as needed thereafter.  No improvement plan to follow-up with Dr. Watt

## 2024-05-17 IMAGING — DX DG LUMBAR SPINE COMPLETE 4+V
5 series · 5 of 5 positions shown · non-contrast
Comparison: None Available.

CLINICAL DATA: Lumbar pain since Sunday October, 2021.

EXAM:
LUMBAR SPINE - COMPLETE 4+ VIEW

[lumbar spine ap]
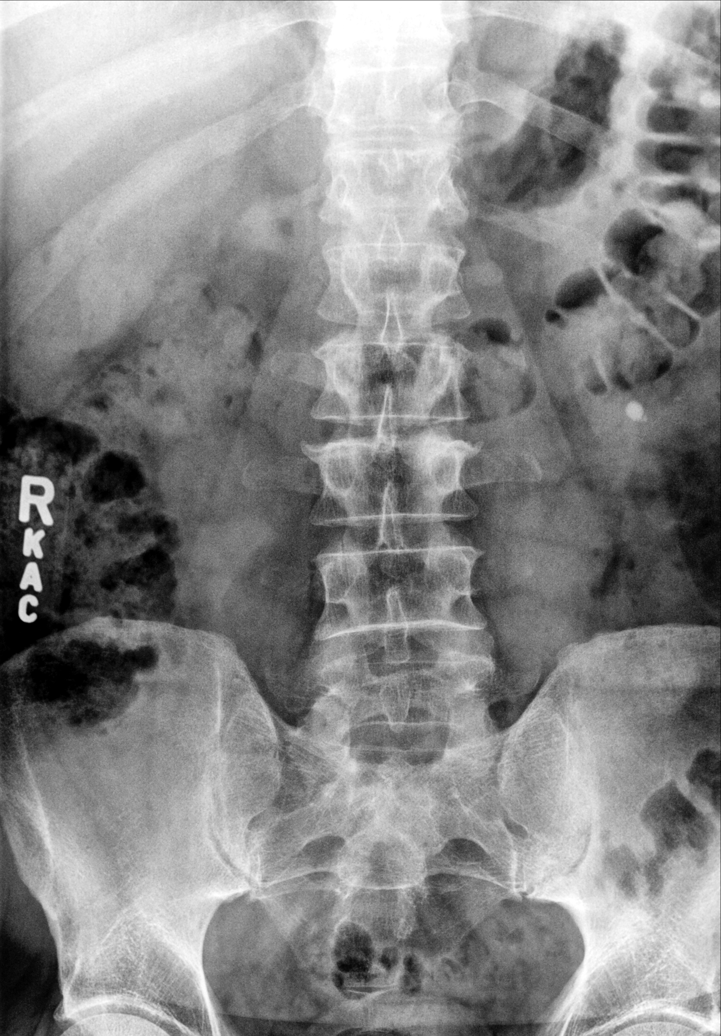

[lumbar spine mlo (1 of 2)]
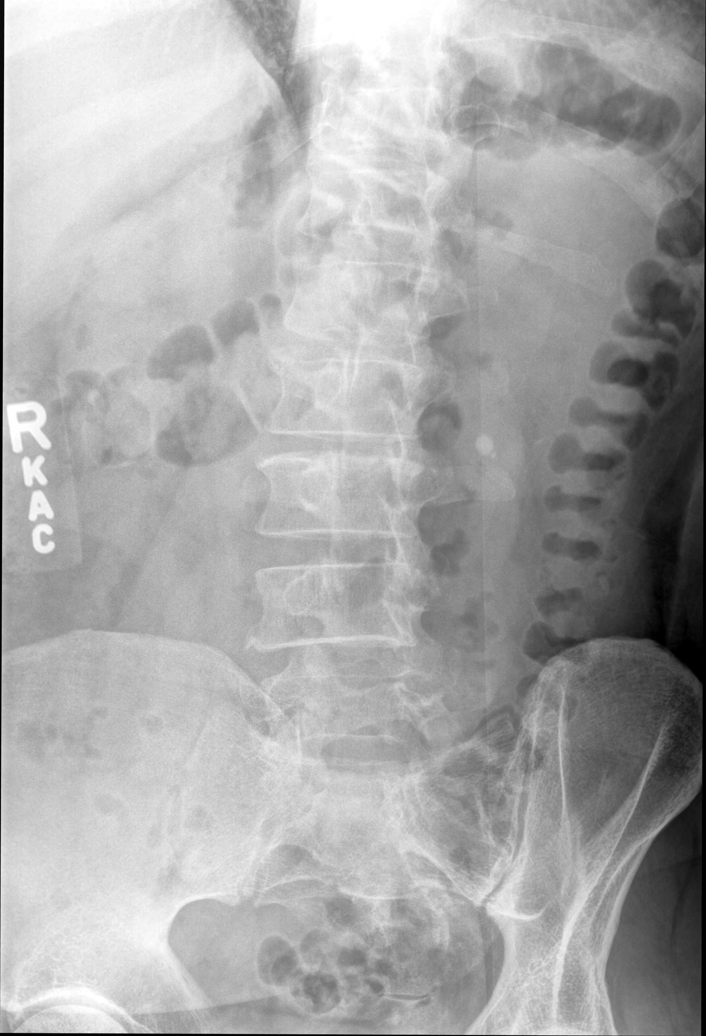

[lumbar spine mlo (2 of 2)]
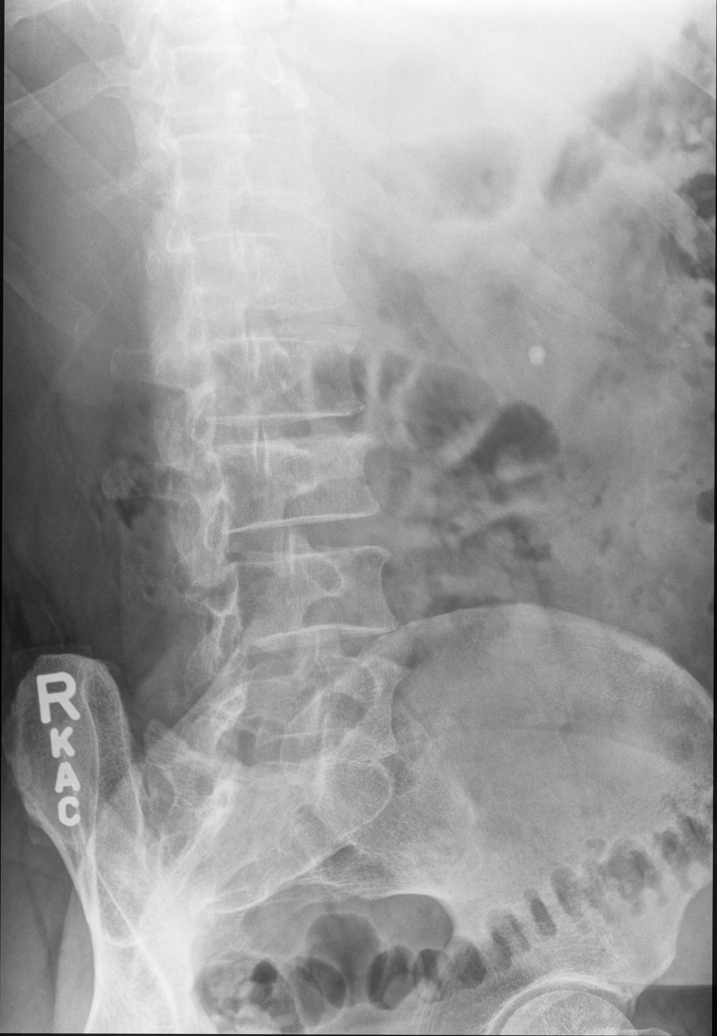

[lumbar spine lat (1 of 2)]
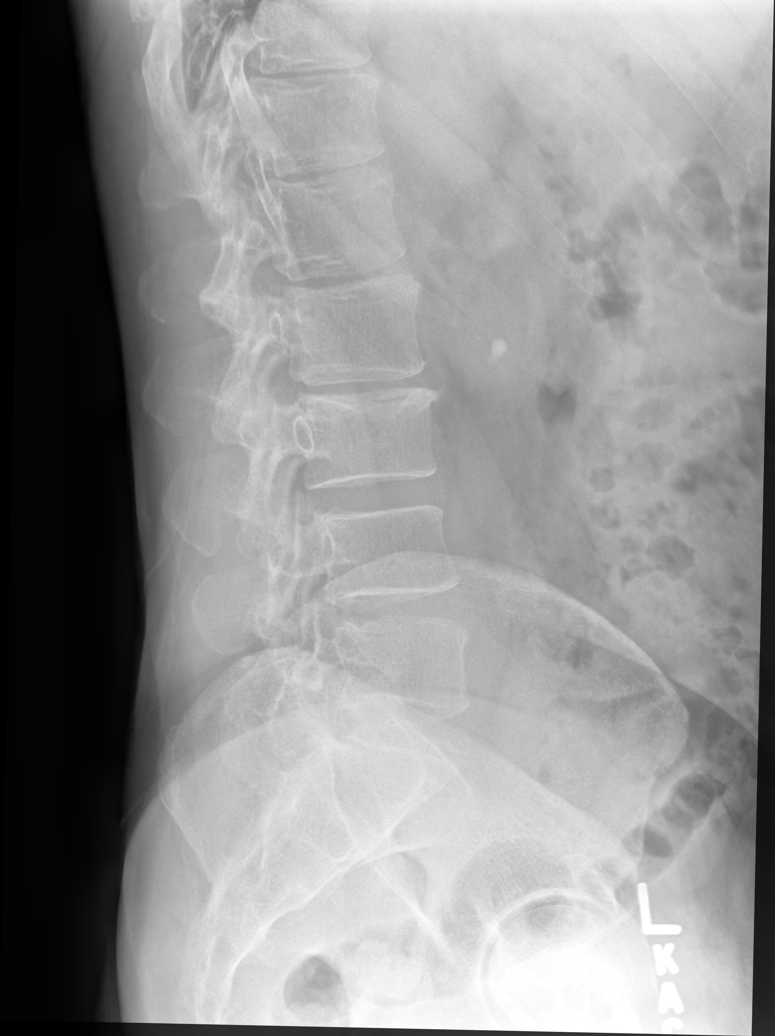

[lumbar spine lat (2 of 2)]
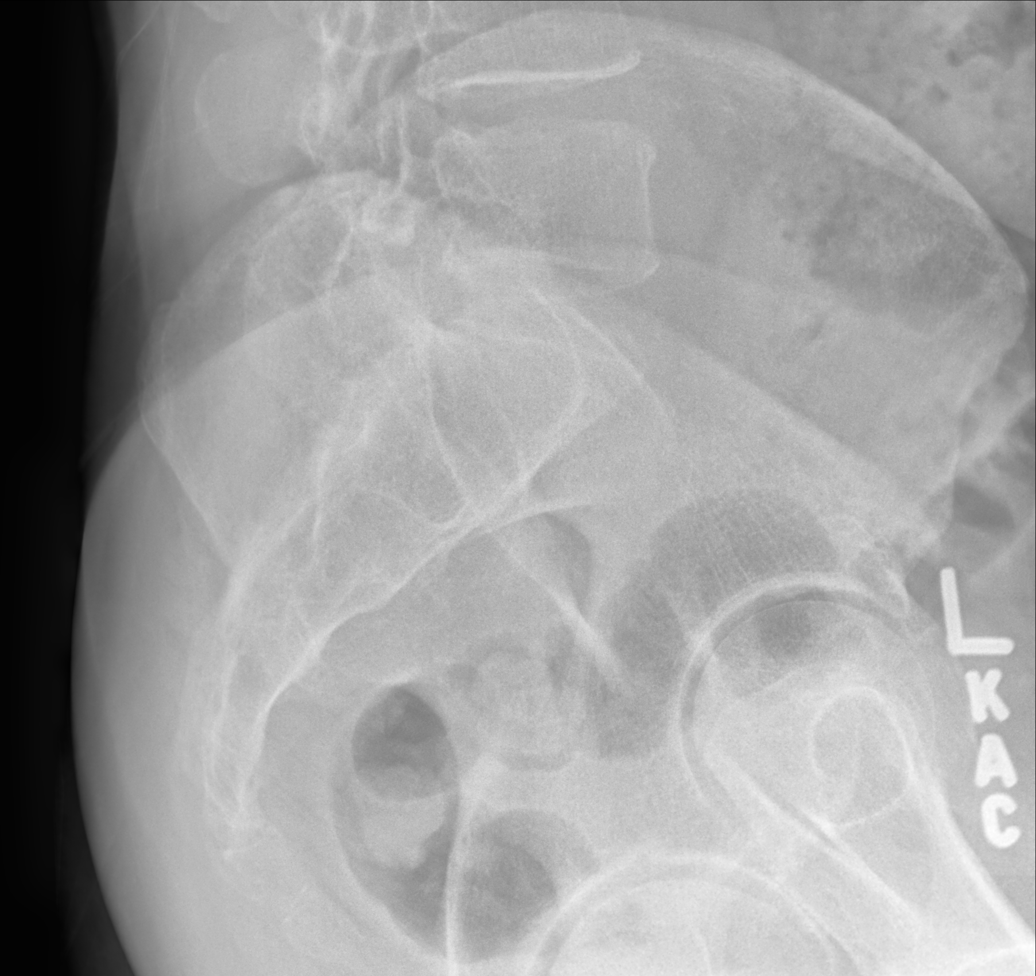

[5 of 5 positions shown; findings below may reference images not displayed]

FINDINGS: There is no evidence of lumbar spine fracture. Alignment is normal.
There is mild decreased intervertebral space at L1-2 and L2-3. Mild
chronic compression deformity the L3 superior endplate with
associated osteophytosis are identified.
IMPRESSION: Mild degenerative joint changes of lumbar spine.

## 2024-06-01 ENCOUNTER — Other Ambulatory Visit: Payer: Self-pay | Admitting: Nurse Practitioner

## 2024-06-01 DIAGNOSIS — M7711 Lateral epicondylitis, right elbow: Secondary | ICD-10-CM

## 2024-06-03 ENCOUNTER — Ambulatory Visit

## 2024-07-06 ENCOUNTER — Encounter: Payer: Self-pay | Admitting: Nurse Practitioner

## 2024-07-09 ENCOUNTER — Other Ambulatory Visit: Payer: Self-pay | Admitting: Nurse Practitioner

## 2024-07-09 DIAGNOSIS — M7711 Lateral epicondylitis, right elbow: Secondary | ICD-10-CM

## 2024-07-20 NOTE — Progress Notes (Unsigned)
     Jonathan Pitz T. Nazire Fruth, MD, CAQ Sports Medicine [facility] at [facility] [address] [place], [address]  Phone: [phone]  FAX: [phone]  Jonathan Key - 58 y.o. male  MRN [id number]  Date of Birth: 1965-12-25  Date: 07/21/2024  PCP: Wendee Lynwood HERO, NP  Referral: Wendee Lynwood HERO, NP  No chief complaint on file.  Subjective:   Jonathan Key is a 58 y.o. very pleasant male patient with There is no height or weight on file to calculate BMI. who presents with the following:  Discussed the use of AI scribe software for clinical note transcription with the patient, who gave verbal consent to proceed.  Patient is here for evaluation of ongoing shoulder pain.  He has also had some right sided lateral epicondylitis, which is improving. History of Present Illness     Review of Systems is noted in the HPI, as appropriate  Objective:   There were no vitals taken for this visit.  GEN: No acute distress; alert,appropriate. PULM: Breathing comfortably in no respiratory distress PSYCH: Normally interactive.   Laboratory and Imaging Data:  Assessment and Plan:   No diagnosis found. Assessment & Plan   Medication Management during today's office visit: No orders of the defined types were placed in this encounter.  There are no discontinued medications.  Orders placed today for conditions managed today: No orders of the defined types were placed in this encounter.   Disposition: No follow-ups on file.  [employer] One speech-to-text software was used for transcription in this dictation.  Possible transcriptional errors can occur using Animal nutritionist.   Signed,  Jacques DASEN. Loghan Kurtzman, MD   Outpatient Encounter Medications as of 07/21/2024  Medication Sig   Acetaminophen (TYLENOL) 325 MG CAPS Take by mouth as needed.   buPROPion  (WELLBUTRIN  XL) 150 MG 24 hr tablet Take 1 tablet (150 mg total) by mouth daily.   escitalopram  (LEXAPRO ) 10  MG tablet TAKE 1 TABLET BY MOUTH DAILY   Fluocinonide 0.1 % CREA Apply topically.   loratadine (CLARITIN) 10 MG tablet Take 10 mg by mouth daily.   meloxicam  (MOBIC ) 15 MG tablet TAKE 1 TABLET(15 MG) BY MOUTH DAILY   Multiple Vitamin (MULTIVITAMIN) tablet Take 1 tablet by mouth daily.   rosuvastatin  (CRESTOR ) 10 MG tablet Take 1 tablet (10 mg total) by mouth daily.   temazepam  (RESTORIL ) 15 MG capsule Take 1 capsule (15 mg total) by mouth at bedtime.   No facility-administered encounter medications on file as of 07/21/2024.

## 2024-07-21 ENCOUNTER — Encounter: Payer: Self-pay | Admitting: Family Medicine

## 2024-07-21 ENCOUNTER — Ambulatory Visit: Admitting: Family Medicine

## 2024-07-21 ENCOUNTER — Ambulatory Visit
Admission: RE | Admit: 2024-07-21 | Discharge: 2024-07-21 | Disposition: A | Source: Ambulatory Visit | Attending: Family Medicine | Admitting: Family Medicine

## 2024-07-21 VITALS — BP 120/78 | HR 111 | Temp 99.1°F | Ht 69.5 in | Wt 230.5 lb

## 2024-07-21 DIAGNOSIS — M7541 Impingement syndrome of right shoulder: Secondary | ICD-10-CM

## 2024-07-21 DIAGNOSIS — M25511 Pain in right shoulder: Secondary | ICD-10-CM

## 2024-07-21 DIAGNOSIS — M7501 Adhesive capsulitis of right shoulder: Secondary | ICD-10-CM

## 2024-07-22 ENCOUNTER — Ambulatory Visit: Payer: Self-pay | Admitting: Family Medicine

## 2024-08-06 ENCOUNTER — Other Ambulatory Visit: Payer: Self-pay | Admitting: Nurse Practitioner

## 2024-08-06 DIAGNOSIS — M7711 Lateral epicondylitis, right elbow: Secondary | ICD-10-CM

## 2024-09-19 NOTE — Progress Notes (Unsigned)
" ° ° ° °  Arth Nicastro T. Lilla Callejo, MD, CAQ Sports Medicine [facility] [address] [place], [address]  Phone: [phone]  FAX: [phone]  Altus Zaino - 58 y.o. male  MRN [id number]  Date of Birth: 1965-10-18  Date: 09/20/2024  PCP: Wendee Lynwood HERO, NP  Referral: Wendee Lynwood HERO, NP  No chief complaint on file.  Subjective:   Epimenio Schetter is a 58 y.o. very pleasant male patient with There is no height or weight on file to calculate BMI. who presents with the following:  Discussed the use of AI scribe software for clinical note transcription with the patient, who gave verbal consent to proceed.  Gabrian is here for 73-month follow-up.  I last saw him on July 21, 2024, at that point he was having some frozen shoulder on the right.  Also felt like he was having some impingement, and at that time I had him begin [employer] range of motion protocol for frozen shoulder. History of Present Illness     Review of Systems is noted in the HPI, as appropriate  Objective:   There were no vitals taken for this visit.  GEN: No acute distress; alert,appropriate. PULM: Breathing comfortably in no respiratory distress PSYCH: Normally interactive.   Laboratory and Imaging Data:  Assessment and Plan:   No diagnosis found. Assessment & Plan   Medication Management during today's office visit: No orders of the defined types were placed in this encounter.  There are no discontinued medications.  Orders placed today for conditions managed today: No orders of the defined types were placed in this encounter.   Disposition: No follow-ups on file.  [employer] One speech-to-text software was used for transcription in this dictation.  Possible transcriptional errors can occur using Animal nutritionist.   Signed,  Jacques DASEN. Micky Sheller, MD   Outpatient Encounter Medications as of 09/20/2024  Medication Sig   Acetaminophen (TYLENOL) 325 MG CAPS Take by  mouth as needed.   buPROPion  (WELLBUTRIN  XL) 150 MG 24 hr tablet Take 1 tablet (150 mg total) by mouth daily.   escitalopram  (LEXAPRO ) 10 MG tablet TAKE 1 TABLET BY MOUTH DAILY   Fluocinonide 0.1 % CREA Apply topically.   loratadine (CLARITIN) 10 MG tablet Take 10 mg by mouth daily.   Multiple Vitamin (MULTIVITAMIN) tablet Take 1 tablet by mouth daily.   rosuvastatin  (CRESTOR ) 10 MG tablet Take 1 tablet (10 mg total) by mouth daily.   temazepam  (RESTORIL ) 15 MG capsule Take 1 capsule (15 mg total) by mouth at bedtime.   No facility-administered encounter medications on file as of 09/20/2024.   "

## 2024-09-20 ENCOUNTER — Ambulatory Visit: Admitting: Family Medicine

## 2024-09-20 ENCOUNTER — Encounter: Payer: Self-pay | Admitting: Family Medicine

## 2024-09-20 VITALS — BP 122/80 | HR 110 | Temp 98.1°F | Ht 69.5 in | Wt 237.2 lb

## 2024-09-20 DIAGNOSIS — G8929 Other chronic pain: Secondary | ICD-10-CM

## 2024-09-20 DIAGNOSIS — M25511 Pain in right shoulder: Secondary | ICD-10-CM

## 2024-09-20 DIAGNOSIS — M7501 Adhesive capsulitis of right shoulder: Secondary | ICD-10-CM | POA: Diagnosis not present

## 2024-09-20 MED ORDER — TRIAMCINOLONE ACETONIDE 40 MG/ML IJ SUSP
40.0000 mg | Freq: Once | INTRAMUSCULAR | Status: AC
  Administered 2024-09-20: 40 mg via INTRA_ARTICULAR

## 2024-09-29 ENCOUNTER — Other Ambulatory Visit: Payer: Self-pay | Admitting: Nurse Practitioner

## 2024-09-29 DIAGNOSIS — E785 Hyperlipidemia, unspecified: Secondary | ICD-10-CM

## 2024-11-10 ENCOUNTER — Ambulatory Visit: Payer: Managed Care, Other (non HMO) | Admitting: Dermatology

## 2024-11-22 ENCOUNTER — Ambulatory Visit: Admitting: Family Medicine

## 2024-12-01 ENCOUNTER — Encounter: Admitting: Nurse Practitioner

## 2024-12-08 ENCOUNTER — Encounter: Admitting: Nurse Practitioner
